# Patient Record
Sex: Female | Born: 1970 | Race: Black or African American | Marital: Single | Smoking: Never smoker
Health system: Southern US, Community
[De-identification: ages and names within clinical notes are randomized; demographics above are authoritative.]

## PROBLEM LIST (undated history)

## (undated) DIAGNOSIS — Z21 Asymptomatic human immunodeficiency virus [HIV] infection status: Secondary | ICD-10-CM

## (undated) DIAGNOSIS — B20 Human immunodeficiency virus [HIV] disease: Secondary | ICD-10-CM

## (undated) DIAGNOSIS — D219 Benign neoplasm of connective and other soft tissue, unspecified: Secondary | ICD-10-CM

## (undated) HISTORY — DX: Human immunodeficiency virus (HIV) disease: B20

## (undated) HISTORY — DX: Benign neoplasm of connective and other soft tissue, unspecified: D21.9

## (undated) HISTORY — DX: Asymptomatic human immunodeficiency virus (hiv) infection status: Z21

---

## 2013-09-14 ENCOUNTER — Encounter: Payer: Self-pay | Admitting: Family Medicine

## 2013-09-14 ENCOUNTER — Ambulatory Visit (INDEPENDENT_AMBULATORY_CARE_PROVIDER_SITE_OTHER): Payer: Medicaid Other | Admitting: Family Medicine

## 2013-09-14 VITALS — BP 113/88 | HR 71 | Temp 98.1°F | Ht 64.75 in | Wt 143.8 lb

## 2013-09-14 DIAGNOSIS — B20 Human immunodeficiency virus [HIV] disease: Secondary | ICD-10-CM | POA: Insufficient documentation

## 2013-09-14 DIAGNOSIS — Z0289 Encounter for other administrative examinations: Secondary | ICD-10-CM | POA: Insufficient documentation

## 2013-09-14 DIAGNOSIS — Z603 Acculturation difficulty: Secondary | ICD-10-CM | POA: Insufficient documentation

## 2013-09-14 DIAGNOSIS — L708 Other acne: Secondary | ICD-10-CM

## 2013-09-14 DIAGNOSIS — Z609 Problem related to social environment, unspecified: Secondary | ICD-10-CM

## 2013-09-14 DIAGNOSIS — L700 Acne vulgaris: Secondary | ICD-10-CM

## 2013-09-14 MED ORDER — BENZOYL PEROXIDE 3.5 % EX CREA: TOPICAL_CREAM | CUTANEOUS | Status: DC

## 2013-09-14 NOTE — Assessment & Plan Note (Signed)
Patient's medicaid was not active. Unable to perform necessary lab work.  - Will need:   - UA dipstick   - urine preg  - UA ancillary  - CBC w/ diff  - CMP   - Quant gold   - Hep B   - Varicella   - RPR

## 2013-09-14 NOTE — Assessment & Plan Note (Addendum)
Spoke with Dr. Wendie Agreste. He suggested starting atripla but patient has adequate amount of current prescription to last until she can get into ID clinic.  - CD4 count  - HIV  - HIV RNA - referral to ID  - F/u in 6 weeks in immigrant clinic
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
## 2013-09-14 NOTE — Patient Instructions (Addendum)
Thank you for coming in,   We will call you with the lab results from today.   Please continue taking your medications.   Please follow up with our clinic in 6 weeks.    Please feel free to call with any questions or concerns at any time, at [phone]. --Dr. Raeford Razor

## 2013-09-14 NOTE — Assessment & Plan Note (Signed)
Facial acne  - benzoyl peroxide 3.5 % cream

## 2013-09-15 LAB — HIV ANTIBODY (ROUTINE TESTING W REFLEX): HIV 1&2 Ab, 4th Generation: REACTIVE — AB

## 2013-09-15 LAB — HIV-1 RNA QUANT-NO REFLEX-BLD

## 2013-09-15 LAB — T-HELPER CELLS (CD4) COUNT (NOT AT ARMC)
ABSOLUTE CD4: 152 /uL — AB (ref 381–1469)
CD4 T Helper %: 17 % — ABNORMAL LOW (ref 32–62)
TOTAL LYMPHOCYTE COUNT: 893 /uL (ref 700–3300)
TOTAL LYMPHOCYTE: 47 % — AB (ref 12–46)
WBC, lymph enumeration: 1.9 10*3/uL — ABNORMAL LOW (ref 4.0–10.5)

## 2013-09-15 LAB — HIV 1/2 CONFIRMATION
HIV 1 ANTIBODY: POSITIVE — AB
HIV-2 Ab: NEGATIVE

## 2013-09-16 ENCOUNTER — Telehealth: Payer: Self-pay | Admitting: Licensed Clinical Social Worker

## 2013-09-16 NOTE — Telephone Encounter (Signed)
Patient was referred by [facility] to be seen for new 042, I called the patient's case manager which is the number listed and was given the patient's phone number to call her directly. I called the patient and she did not understand me, the brother got on the phone wanting to know why she needs so many appointments and what is wrong. I stated this is a follow up visit and there is nothing urgent going on. I did not tell him the name of our clinic, I contacted a [place] interpreter to talk to the patient directly and patient did not understand her either. The brother once again got on the phone and said he would relay the time of the appointment to the patient. Patient agreed to this and the time and date of the appointment was given. I called the case manager Kim back and gave her details about the appointment including location since she will be bringing her.

## 2013-09-29 ENCOUNTER — Ambulatory Visit
Admission: RE | Admit: 2013-09-29 | Discharge: 2013-09-29 | Disposition: A | Discharge status: Home / Self Care | Payer: Medicaid Other | Source: Ambulatory Visit | Attending: Infectious Disease | Admitting: Infectious Disease

## 2013-09-29 ENCOUNTER — Other Ambulatory Visit: Payer: Self-pay | Admitting: Infectious Disease

## 2013-09-29 DIAGNOSIS — Z0289 Encounter for other administrative examinations: Secondary | ICD-10-CM

## 2013-10-06 ENCOUNTER — Ambulatory Visit (INDEPENDENT_AMBULATORY_CARE_PROVIDER_SITE_OTHER): Payer: Medicaid Other | Admitting: Internal Medicine

## 2013-10-06 ENCOUNTER — Encounter: Payer: Self-pay | Admitting: Internal Medicine

## 2013-10-06 VITALS — BP 119/72 | HR 60 | Temp 97.2°F | Wt 146.0 lb

## 2013-10-06 DIAGNOSIS — Z79899 Other long term (current) drug therapy: Secondary | ICD-10-CM

## 2013-10-06 DIAGNOSIS — Z113 Encounter for screening for infections with a predominantly sexual mode of transmission: Secondary | ICD-10-CM

## 2013-10-06 DIAGNOSIS — Z23 Encounter for immunization: Secondary | ICD-10-CM

## 2013-10-06 DIAGNOSIS — B20 Human immunodeficiency virus [HIV] disease: Secondary | ICD-10-CM

## 2013-10-06 LAB — LIPID PANEL
CHOLESTEROL: 175 mg/dL (ref 0–200)
HDL: 55 mg/dL (ref >39)
LDL Cholesterol: 102 mg/dL — ABNORMAL HIGH (ref 0–99)
TRIGLYCERIDES: 90 mg/dL (ref <150)
Total CHOL/HDL Ratio: 3.2 Ratio
VLDL: 18 mg/dL (ref 0–40)

## 2013-10-06 LAB — HEPATITIS A ANTIBODY, TOTAL: Hep A Total Ab: REACTIVE — AB

## 2013-10-06 LAB — HEPATITIS C ANTIBODY: HCV AB: NEGATIVE

## 2013-10-06 LAB — HEPATITIS B SURFACE ANTIGEN: Hepatitis B Surface Ag: NEGATIVE

## 2013-10-06 LAB — RPR

## 2013-10-06 LAB — HEPATITIS B CORE ANTIBODY, TOTAL: HEP B C TOTAL AB: NONREACTIVE

## 2013-10-06 LAB — HEPATITIS B SURFACE ANTIBODY,QUALITATIVE: HEP B S AB: NEGATIVE

## 2013-10-06 MED ORDER — SULFAMETHOXAZOLE-TRIMETHOPRIM 400-80 MG PO TABS: 1.0000 | ORAL_TABLET | Freq: Every day | ORAL | Status: DC

## 2013-10-06 MED ORDER — ELVITEG-COBIC-EMTRICIT-TENOFDF 150-150-200-300 MG PO TABS: 1.0000 | ORAL_TABLET | Freq: Every day | ORAL | Status: DC
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
## 2013-10-07 LAB — CRYPTOCOCCAL ANTIGEN: CRYPTO AG: NEGATIVE

## 2013-10-07 LAB — GLUCOSE 6 PHOSPHATE DEHYDROGENASE: G-6PDH: 11.5 U/g{Hb} (ref 7.0–20.5)

## 2013-10-08 LAB — TOXOPLASMA GONDII ANTIBODY, IGG

## 2013-10-11 LAB — HLA B*5701: HLA-B 5701 W/RFLX HLA-B HIGH: NEGATIVE

## 2013-10-20 ENCOUNTER — Ambulatory Visit (INDEPENDENT_AMBULATORY_CARE_PROVIDER_SITE_OTHER): Payer: Medicaid Other | Admitting: Family Medicine

## 2013-10-20 ENCOUNTER — Encounter: Payer: Self-pay | Admitting: Family Medicine

## 2013-10-20 VITALS — BP 125/70 | HR 66 | Temp 98.2°F | Ht 65.0 in | Wt 148.5 lb

## 2013-10-20 DIAGNOSIS — H5210 Myopia, unspecified eye: Secondary | ICD-10-CM

## 2013-10-20 DIAGNOSIS — Z0289 Encounter for other administrative examinations: Secondary | ICD-10-CM

## 2013-10-20 DIAGNOSIS — B20 Human immunodeficiency virus [HIV] disease: Secondary | ICD-10-CM

## 2013-10-20 DIAGNOSIS — K029 Dental caries, unspecified: Secondary | ICD-10-CM

## 2013-10-20 DIAGNOSIS — R1013 Epigastric pain: Secondary | ICD-10-CM

## 2013-10-20 DIAGNOSIS — L700 Acne vulgaris: Secondary | ICD-10-CM

## 2013-10-20 DIAGNOSIS — H539 Unspecified visual disturbance: Secondary | ICD-10-CM

## 2013-10-20 DIAGNOSIS — L708 Other acne: Secondary | ICD-10-CM

## 2013-10-20 MED ORDER — OMEPRAZOLE 40 MG PO CPDR: 40.0000 mg | DELAYED_RELEASE_CAPSULE | Freq: Every day | ORAL | Status: DC

## 2013-10-20 MED ORDER — ADAPALENE-BENZOYL PEROXIDE 0.1-2.5 % EX GEL: CUTANEOUS | Status: DC

## 2013-10-20 NOTE — Patient Instructions (Addendum)
Take the prescription to the pharmacy.  It will be a cream for your acne for at night.   I am starting a medicine to help the acid in your stomach.    Call the number provided for the dentist.  Keep taking your HIV medicine.  I will refer you to an eye doctor.   Come back to see me in 4-6 weeks so we can do a pelvic exam and Pap smear.    It was good to see you again today.

## 2013-10-20 NOTE — Progress Notes (Signed)
Subjective:    Carolyn Williamson is a 43 y.o. female who presents to [facility] today for FU from [facility] about 1 month ago.  Since that time she has been seen at [facility] Clinic and started on Stribild and Bactrim 1 daily for prophylactic dosing.  She reports taking this medication daily.    1.  Acne: Not much improvement with Benzoyl peroxide.  Would like to try something different.  Washing face twice daily.    2.  Dental hygiene:  Asking for referral for dentist.    3.  HIV: Felt "a little weak" after starting the Stribild.  Now improved.  Taking Bactrim as well.  Overall feels well.   4.  Vision Problems:  States that she has difficulty seeing/reading up close.  Would like referral for eye doctor.     Prev health:  Currently overdue for Pap smear.  ROS as above per HPI, otherwise neg.    The following portions of the patient's history were reviewed and updated as appropriate: allergies, current medications, past medical history, family and social history, and problem list. Patient is a nonsmoker.    PMH reviewed.  Past Medical History  Diagnosis Date  . HIV (human immunodeficiency virus infection)   . Fibroids    No past surgical history on file.  Medications reviewed. Current Outpatient Prescriptions  Medication Sig Dispense Refill  . Benzoyl Peroxide 3.5 % CREA Apply a small amount to face at night  45 g  1  . elvitegravir-cobicistat-emtricitabine-tenofovir (STRIBILD) 150-150-200-300 MG TABS tablet Take 1 tablet by mouth daily with breakfast.  30 tablet  11  . sulfamethoxazole-trimethoprim (BACTRIM) 400-80 MG per tablet Take 1 tablet by mouth daily.  30 tablet  3   No current facility-administered medications for this visit.     Objective:   Physical Exam BP 125/70  Pulse 66  Temp(Src) 98.2 F (36.8 C) (Oral)  Ht 5\' 5"  (1.651 m)  Wt 148 lb 8 oz (67.359 kg)  BMI 24.71 kg/m2  LMP 05/09/2013 Gen:  Alert, cooperative patient who appears stated age in  no acute distress.  Vital signs reviewed. HEENT: EOMI,  MMM.  Poor dental hygiene noted.  Cardiac:  Regular rate and rhythm Pulm:  Clear to auscultation Abd:  Soft/nondistended.  Some mild TTP epigastrum.  Hepatojugular reflex to 7 cm above clavicle. Exts: Thin, no edema  No results found for this or any previous visit (from the past 72 hour(s)).

## 2013-10-21 ENCOUNTER — Encounter: Payer: Self-pay | Admitting: *Deleted

## 2013-10-21 DIAGNOSIS — K029 Dental caries, unspecified: Secondary | ICD-10-CM | POA: Insufficient documentation

## 2013-10-21 DIAGNOSIS — R1013 Epigastric pain: Secondary | ICD-10-CM | POA: Insufficient documentation

## 2013-10-21 DIAGNOSIS — H5210 Myopia, unspecified eye: Secondary | ICD-10-CM | POA: Insufficient documentation

## 2013-10-21 NOTE — Assessment & Plan Note (Signed)
Appreciate ID recommendations.  Continue Stribild/Bactrim. Has FU appt scheduled. Encouraged compliance.

## 2013-10-21 NOTE — Assessment & Plan Note (Signed)
Attempt Epiduo.  May need medicaid prior auth

## 2013-10-21 NOTE — Assessment & Plan Note (Signed)
Found on exam. After discussion, sounds like she has GERD-like symptoms after eating.   Trial of PPI.

## 2013-10-21 NOTE — Assessment & Plan Note (Signed)
Vision acuity today. Will refer to ophtho due to longstanding disease and history of HIV.

## 2013-10-21 NOTE — Assessment & Plan Note (Signed)
States she was seen at [facility] since last visit here.  Will need to obtain records.    Also, hepatogular reflux noted on exam with elevated JVD.  No hepatomegaly.  No cardiac murmur noted.  Needs CMET, will see if we need to obtain refugee labs as well.

## 2013-10-21 NOTE — Assessment & Plan Note (Signed)
Given list of dentists who accept Medicaid today.

## 2013-11-03 ENCOUNTER — Ambulatory Visit (INDEPENDENT_AMBULATORY_CARE_PROVIDER_SITE_OTHER): Payer: Medicaid Other | Admitting: Internal Medicine

## 2013-11-03 ENCOUNTER — Encounter: Payer: Self-pay | Admitting: Internal Medicine

## 2013-11-03 VITALS — BP 125/78 | HR 60 | Temp 97.8°F | Wt 149.0 lb

## 2013-11-03 DIAGNOSIS — M545 Low back pain, unspecified: Secondary | ICD-10-CM

## 2013-11-03 DIAGNOSIS — Z23 Encounter for immunization: Secondary | ICD-10-CM

## 2013-11-03 MED ORDER — TRAMADOL HCL 50 MG PO TABS: 50.0000 mg | ORAL_TABLET | Freq: Two times a day (BID) | ORAL | Status: DC | PRN
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
## 2013-11-30 ENCOUNTER — Other Ambulatory Visit (HOSPITAL_COMMUNITY)
Admission: RE | Admit: 2013-11-30 | Discharge: 2013-11-30 | Disposition: A | Discharge status: Home / Self Care | Payer: Medicaid Other | Source: Ambulatory Visit | Attending: Family Medicine | Admitting: Family Medicine

## 2013-11-30 ENCOUNTER — Ambulatory Visit (INDEPENDENT_AMBULATORY_CARE_PROVIDER_SITE_OTHER): Payer: Medicaid Other | Admitting: Family Medicine

## 2013-11-30 VITALS — BP 128/67 | HR 55 | Temp 98.7°F | Ht 65.0 in | Wt 151.8 lb

## 2013-11-30 DIAGNOSIS — Z1151 Encounter for screening for human papillomavirus (HPV): Secondary | ICD-10-CM | POA: Diagnosis present

## 2013-11-30 DIAGNOSIS — L700 Acne vulgaris: Secondary | ICD-10-CM

## 2013-11-30 DIAGNOSIS — Z124 Encounter for screening for malignant neoplasm of cervix: Secondary | ICD-10-CM

## 2013-11-30 DIAGNOSIS — Z01419 Encounter for gynecological examination (general) (routine) without abnormal findings: Secondary | ICD-10-CM | POA: Insufficient documentation

## 2013-11-30 DIAGNOSIS — B20 Human immunodeficiency virus [HIV] disease: Secondary | ICD-10-CM

## 2013-11-30 DIAGNOSIS — H5210 Myopia, unspecified eye: Secondary | ICD-10-CM

## 2013-11-30 DIAGNOSIS — L708 Other acne: Secondary | ICD-10-CM

## 2013-11-30 DIAGNOSIS — H5213 Myopia, bilateral: Secondary | ICD-10-CM

## 2013-11-30 DIAGNOSIS — M545 Low back pain, unspecified: Secondary | ICD-10-CM | POA: Insufficient documentation

## 2013-11-30 MED ORDER — TRAMADOL HCL 50 MG PO TABS: 50.0000 mg | ORAL_TABLET | Freq: Two times a day (BID) | ORAL | Status: DC | PRN

## 2013-11-30 MED ORDER — ADAPALENE 0.1 % EX LOTN: TOPICAL_LOTION | CUTANEOUS | Status: DC

## 2013-11-30 NOTE — Assessment & Plan Note (Signed)
Similar complaints as previous visit; previous referral documented as planned but order not entered. Order for referral to ophthalmology placed today. F/u as needed.

## 2013-11-30 NOTE — Assessment & Plan Note (Signed)
Appreciate ID assistance. No signs / symptoms to suggest infection. Continue Strilbid and Bactrim prophylaxis. F/u with ID as scheduled. Reiterated importance of compliance with medications.

## 2013-11-30 NOTE — Assessment & Plan Note (Signed)
Self-limited, chronic, without red flags on exam or per history. Exacerbated by standing long periods at work. Refilled Rx for tramadol. F/u PRN.
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
## 2013-11-30 NOTE — Assessment & Plan Note (Signed)
Rx for Differ[place] branded (Medicaid preferred). F/u as needed.

## 2013-11-30 NOTE — Patient Instructions (Addendum)
The eye doctor will call you in several weeks to set up an appointment.  Take the Tramadol when you need it for back pain.    Use the face cream at night to help with acne.    Make an appointment to see me in about 3 months to make sure you are doing well.

## 2013-12-01 LAB — CYTOLOGY - PAP

## 2013-12-02 ENCOUNTER — Telehealth: Payer: Self-pay | Admitting: *Deleted

## 2013-12-02 ENCOUNTER — Encounter: Payer: Self-pay | Admitting: *Deleted

## 2013-12-02 ENCOUNTER — Ambulatory Visit (INDEPENDENT_AMBULATORY_CARE_PROVIDER_SITE_OTHER): Payer: Medicaid Other | Admitting: Internal Medicine

## 2013-12-02 ENCOUNTER — Encounter: Payer: Self-pay | Admitting: Family Medicine

## 2013-12-02 VITALS — BP 129/75 | HR 60 | Temp 97.3°F | Wt 155.0 lb

## 2013-12-02 DIAGNOSIS — Z21 Asymptomatic human immunodeficiency virus [HIV] infection status: Secondary | ICD-10-CM

## 2013-12-02 DIAGNOSIS — H5210 Myopia, unspecified eye: Secondary | ICD-10-CM

## 2013-12-02 DIAGNOSIS — H109 Unspecified conjunctivitis: Secondary | ICD-10-CM

## 2013-12-02 DIAGNOSIS — H5213 Myopia, bilateral: Secondary | ICD-10-CM

## 2013-12-02 MED ORDER — NAPHAZOLINE-GLYCERIN-ZINC SULF 0.012-0.25-0.25 % OP SOLN: 2.0000 [drp] | Freq: Every day | OPHTHALMIC | Status: DC

## 2013-12-02 NOTE — Telephone Encounter (Signed)
Please see note from [place].  Left voice message on nurse needing a referral for Myopia.  Please give the office a call at [phone].  Derl Barrow, RN

## 2013-12-02 NOTE — Telephone Encounter (Signed)
This encounter was created in error - please disregard.

## 2013-12-02 NOTE — Progress Notes (Signed)
Patient ID: Carolyn Williamson, female   DOB: 11-22-1970, 43 y.o.   MRN: [id number]       Patient ID: Carolyn Williamson, female   DOB: December 09, 1970, 43 y.o.   MRN: [id number]  HPI Carolyn Williamson is doing well with stribild not missing a dose. 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 Has eye irritation. Dry eyes which she is rubbing often. Also needs glasses.  Denies fever, chills, nightsweats, cough. She is concerned about the bills she is getting $3 copay for each of her prescriptions as well as a bill from the hospital. She is not currently working.  Outpatient Encounter Prescriptions as of 12/02/2013  Medication Sig  . Adapalene (DIFFERIN) 0.1 % LOTN Apply small amount to face at night  . elvitegravir-cobicistat-emtricitabine-tenofovir (STRIBILD) 150-150-200-300 MG TABS tablet Take 1 tablet by mouth daily with breakfast.  . omeprazole (PRILOSEC) 40 MG capsule Take 1 capsule (40 mg total) by mouth daily.  Marland Kitchen sulfamethoxazole-trimethoprim (BACTRIM) 400-80 MG per tablet Take 1 tablet by mouth daily.  . traMADol (ULTRAM) 50 MG tablet Take 1 tablet (50 mg total) by mouth every 12 (twelve) hours as needed.     Patient Active Problem List   Diagnosis Date Noted  . Low back pain 11/30/2013  . Abdominal pain, epigastric 10/21/2013  . Myopia 10/21/2013  . Dental caries 10/21/2013  . Immigrant with language difficulty 09/14/2013  . Refugee health examination 09/14/2013  . HIV disease 09/14/2013  . Acne vulgaris 09/14/2013     Health Maintenance Due  Topic Date Due  . Tetanus/tdap  08/03/1989     Review of Systems + back pain, difficulty reading. 10 point ros is negative Physical Exam   BP 129/75  Pulse 60  Temp(Src) 97.3 F (36.3 C) (Oral)  Wt 155 lb (70.308 kg)  LMP 05/04/2013 Physical Exam  Constitutional:  oriented to person, place, and time. appears well-developed and well-nourished. No distress.  HENT:  Mouth/Throat: Oropharynx is clear and  moist. No oropharyngeal exudate. +bilateral conjunctivitis Cardiovascular: Normal rate, regular rhythm and normal heart sounds. Exam reveals no gallop and no friction rub.  No murmur heard.  Pulmonary/Chest: Effort normal and breath sounds normal. No respiratory distress.  has no wheezes.  Abdominal: Soft. Bowel sounds are normal.  exhibits no distension. There is no tenderness.  Lymphadenopathy: no cervical adenopathy.  Neurological: alert and oriented to person, place, and time.  Skin: Skin is warm and dry. No rash noted. No erythema.  Psychiatric: a normal mood and affect.  behavior is normal.   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

## 2013-12-02 NOTE — Telephone Encounter (Signed)
Referral for Myopia noted in 11/30/13 OV notes by Dr. Mingo Amber.  Called to confirm that this referral is originating at [facility].  Pt has [employer] and requiring Medicaid approval.  Requested a call back to assure referral has been started.

## 2013-12-03 LAB — HIV-1 RNA QUANT-NO REFLEX-BLD: HIV 1 RNA Quant: <20 copies/mL (ref <20)

## 2013-12-03 LAB — T-HELPER CELL (CD4) - (RCID CLINIC ONLY)
CD4 % Helper T Cell: 14 % — ABNORMAL LOW (ref 33–55)
CD4 T Cell Abs: 120 /uL — ABNORMAL LOW (ref 400–2700)

## 2014-01-05 ENCOUNTER — Encounter: Payer: Self-pay | Admitting: Family Medicine

## 2014-01-05 ENCOUNTER — Ambulatory Visit (INDEPENDENT_AMBULATORY_CARE_PROVIDER_SITE_OTHER): Payer: Medicaid Other | Admitting: Family Medicine

## 2014-01-05 VITALS — BP 122/79 | HR 84 | Temp 98.4°F | Ht 65.0 in | Wt 156.0 lb

## 2014-01-05 DIAGNOSIS — J302 Other seasonal allergic rhinitis: Secondary | ICD-10-CM

## 2014-01-05 DIAGNOSIS — M545 Low back pain, unspecified: Secondary | ICD-10-CM

## 2014-01-05 DIAGNOSIS — Z603 Acculturation difficulty: Secondary | ICD-10-CM

## 2014-01-05 DIAGNOSIS — L700 Acne vulgaris: Secondary | ICD-10-CM

## 2014-01-05 DIAGNOSIS — K029 Dental caries, unspecified: Secondary | ICD-10-CM

## 2014-01-05 DIAGNOSIS — E28319 Asymptomatic premature menopause: Secondary | ICD-10-CM

## 2014-01-05 DIAGNOSIS — H5213 Myopia, bilateral: Secondary | ICD-10-CM

## 2014-01-05 MED ORDER — CETIRIZINE HCL 10 MG PO TABS: 10.0000 mg | ORAL_TABLET | Freq: Every day | ORAL | Status: DC

## 2014-01-05 NOTE — Progress Notes (Signed)
Subjective:    Carolyn Williamson is a 43 y.o. female who presents to [facility] today for several issues:  1.  Headaches:  Also with feelings of "itchiness" and "full body heat" which occur at home.  This has only been present x 2 weeks, although HA's predate this.  Not at work.  HA's are parietal, bilateral, and worse with stress better with rest.  These other symptoms occur when she is at home.  MP Feb 28.  Was having gradually more spread out menses in months leading up to final period, sometimes as many as 6 months apart.      2.  Decreased vision:  Difficulty seeing small letters up close.  Desires glasses.  Has never had them previously.    Some back pain with standing at her job.  Better with Tramadol.   Does endorse some anxiety, but denies any flashbacks, bad dreams, or worries about her past.  DOES endorse financial worries over her future.   ROS as above per HPI, otherwise neg.  Pertinently, no chest pain, palpitations, SOB, Fever, Chills, Abd pain, N/V/D.   The following portions of the patient's history were reviewed and updated as appropriate: allergies, current medications, past medical history, family and social history, and problem list. Patient is a nonsmoker.    PMH reviewed.  Past Medical History  Diagnosis Date  . HIV (human immunodeficiency virus infection)   . Fibroids    No past surgical history on file.  Medications reviewed. Current Outpatient Prescriptions  Medication Sig Dispense Refill  . Adapalene (DIFFERIN) 0.1 % LOTN Apply small amount to face at night  59 mL  3  . elvitegravir-cobicistat-emtricitabine-tenofovir (STRIBILD) 150-150-200-300 MG TABS tablet Take 1 tablet by mouth daily with breakfast.  30 tablet  11  . Naphazoline-Glycerin-Zinc Sulf (CLEAR EYES MAXIMUM ITCHY EYE) 0.012-0.25-0.25 % SOLN Apply 2 drops to eye daily.  1 Bottle  0  . omeprazole (PRILOSEC) 40 MG capsule Take 1 capsule (40 mg total) by mouth daily.  30 capsule  3  .  sulfamethoxazole-trimethoprim (BACTRIM) 400-80 MG per tablet Take 1 tablet by mouth daily.  30 tablet  3  . traMADol (ULTRAM) 50 MG tablet Take 1 tablet (50 mg total) by mouth every 12 (twelve) hours as needed.  45 tablet  1   No current facility-administered medications for this visit.     Objective:   Physical Exam BP 122/79  Pulse 84  Temp(Src) 98.4 F (36.9 C) (Oral)  Ht 5\' 5"  (1.651 m)  Wt 156 lb (70.761 kg)  BMI 25.96 kg/m2 Gen:  Alert, cooperative patient who appears stated age in no acute distress.  Vital signs reviewed. HEENT: EOMI,  MMM Skin:  Closed comedones scattered across cheeks and nose.  Better than prior exam.  Cardiac:  Regular rate and rhythm  Pulm:  Clear to auscultation bilaterally    No results found for this or any previous visit (from the past 72 hour(s)).

## 2014-01-05 NOTE — Patient Instructions (Signed)
Bring your bills next time you come.  Call the dentist.  Take the Zyrtec for your heat and itching.    I will try to find glasses for you.    Come back in 4-6 weeks

## 2014-01-06 DIAGNOSIS — J302 Other seasonal allergic rhinitis: Secondary | ICD-10-CM | POA: Insufficient documentation

## 2014-01-06 DIAGNOSIS — E28319 Asymptomatic premature menopause: Secondary | ICD-10-CM | POA: Insufficient documentation

## 2014-01-06 NOTE — Assessment & Plan Note (Addendum)
Provided with dental list again today.  To bring this back if having trouble making appt and someone here can call. Circled HD number as one that takes Medicaid for her to call.

## 2014-01-06 NOTE — Assessment & Plan Note (Signed)
Only started differin last week.  To continue with this. Add moisturizer

## 2014-01-06 NOTE — Assessment & Plan Note (Signed)
Think her "heat" and full body itching are seasonal allergies. Zyrtec for relief. 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  Can attempt OCP's next visit if persists despite Zyrtec.

## 2014-01-06 NOTE — Assessment & Plan Note (Signed)
Better with tramadol.

## 2014-01-06 NOTE — Assessment & Plan Note (Signed)
She is very concerned over her bills.  States she still has to pay $3 copay each time she picks up meds.  Last HIV note would look into waiving this fee. I will touch base with our social worker to see if this is a possibility.

## 2014-01-06 NOTE — Assessment & Plan Note (Signed)
Presbyopia.   Will contact social worker about decreased cost for glasses.

## 2014-01-08 ENCOUNTER — Telehealth: Payer: Self-pay | Admitting: Clinical

## 2014-01-08 NOTE — Telephone Encounter (Signed)
With the use of [employer] CSW left a message for Carolyn Williamson.  CSW has been able to get Carolyn Williamson a referral to an optometrist for an eye exam and glasses:  Dr. Webb Laws [address], [place] (appt to be scheduled with Marcie Bal or Anderson Malta)  The patient will be responsible to pay $25 at the visit and the [facility] will be billed for the rest of the amount. CSW will try to contact Carolyn Williamson again later today.  Hunt Oris, MSW, [place]

## 2014-01-14 ENCOUNTER — Telehealth: Payer: Self-pay | Admitting: Internal Medicine

## 2014-01-14 ENCOUNTER — Ambulatory Visit (INDEPENDENT_AMBULATORY_CARE_PROVIDER_SITE_OTHER): Payer: Medicaid Other | Admitting: Internal Medicine

## 2014-01-14 ENCOUNTER — Encounter: Payer: Self-pay | Admitting: Internal Medicine

## 2014-01-14 VITALS — BP 126/79 | HR 72 | Temp 98.3°F | Wt 159.8 lb

## 2014-01-14 DIAGNOSIS — R110 Nausea: Secondary | ICD-10-CM

## 2014-01-14 DIAGNOSIS — Z21 Asymptomatic human immunodeficiency virus [HIV] infection status: Secondary | ICD-10-CM | POA: Diagnosis not present

## 2014-01-14 DIAGNOSIS — K5900 Constipation, unspecified: Secondary | ICD-10-CM | POA: Diagnosis not present

## 2014-01-14 DIAGNOSIS — Z23 Encounter for immunization: Secondary | ICD-10-CM | POA: Diagnosis present

## 2014-01-14 NOTE — Telephone Encounter (Signed)
I noticed after visit that no recent cbc and bmp have been done. We will have her come in on 11/9 for blood work

## 2014-01-14 NOTE — Progress Notes (Signed)
Patient ID: Carolyn Williamson, female   DOB: 08-Oct-1970, 43 y.o.   MRN: [id number]       Patient ID: Carolyn Williamson, female   DOB: [birth year], 43 y.o.   MRN: [id number]  HPI  43yo F with HIV disease, CD 4 count of 120/VL<20 on stribild and bacrim for OI proph. Follows up in family practice clinic refugee health. She reports nausea nad constipation for hte past 2 wk. Occasionally goes to work and has nausea and feels light-headed, does not endorse drinking much fluid.  Here with interpreter.no difficulty with taking medications  Outpatient Encounter Prescriptions as of 01/14/2014  Medication Sig  . Adapalene (DIFFERIN) 0.1 % LOTN Apply small amount to face at night  . cetirizine (ZYRTEC) 10 MG tablet Take 1 tablet (10 mg total) by mouth daily.  Marland Kitchen elvitegravir-cobicistat-emtricitabine-tenofovir (STRIBILD) 150-150-200-300 MG TABS tablet Take 1 tablet by mouth daily with breakfast.  . Naphazoline-Glycerin-Zinc Sulf (CLEAR EYES MAXIMUM ITCHY EYE) 0.012-0.25-0.25 % SOLN Apply 2 drops to eye daily.  Marland Kitchen omeprazole (PRILOSEC) 40 MG capsule Take 1 capsule (40 mg total) by mouth daily.  Marland Kitchen sulfamethoxazole-trimethoprim (BACTRIM) 400-80 MG per tablet Take 1 tablet by mouth daily.  . traMADol (ULTRAM) 50 MG tablet Take 1 tablet (50 mg total) by mouth every 12 (twelve) hours as needed.     Patient Active Problem List   Diagnosis Date Noted  . Premature menopause 01/06/2014  . Seasonal allergies 01/06/2014  . Low back pain 11/30/2013  . Abdominal pain, epigastric 10/21/2013  . Myopia 10/21/2013  . Dental caries 10/21/2013  . Immigrant with language difficulty 09/14/2013  . Refugee health examination 09/14/2013  . HIV disease 09/14/2013  . Acne vulgaris 09/14/2013     Health Maintenance Due  Topic Date Due  . TETANUS/TDAP  08/03/1989     Review of Systems + constipation, occ nausea Physical Exam   BP 126/79 mmHg  Pulse 72  Temp(Src) 98.3 F (36.8 C) (Oral)  Wt 159 lb 12 oz (72.462 kg)  LMP  04/16/2013 Physical Exam  Constitutional:  oriented to person, place, and time. appears well-developed and well-nourished. No distress.  HENT:  Mouth/Throat: Oropharynx is clear and moist. No oropharyngeal exudate.  Cardiovascular: Normal rate, regular rhythm and normal heart sounds. Exam reveals no gallop and no friction rub.  No murmur heard.  Pulmonary/Chest: Effort normal and breath sounds normal. No respiratory distress.  has no wheezes.  Abdominal: Soft. Bowel sounds are normal.  exhibits no distension. There is no tenderness.  Lymphadenopathy: no cervical adenopathy.  Neurological: alert and oriented to person, place, and time.  Skin: Skin is warm and dry. No rash noted. No erythema. Dry skin to face only Psychiatric: a normal mood and affect. His behavior is normal.   Lab Results  Component Value Date   CD4TCELL 14* 12/02/2013   Lab Results  Component Value Date   CD4TABS 120* 12/02/2013   Lab Results  Component Value Date   HIV1RNAQUANT <20 12/02/2013   Lab Results  Component Value Date   HEPBSAB NEG 10/06/2013    Assessment and Plan  hiv = will repeat labs today to ensure she is doing well  Health promotion = all ready received flu vaccination and pneumonia  Constipation = nausea can also be part of this process. Recommended to try increasing oral intake of fluids. Will give rx for colace to help loosen stool  Dizziness = described in setting of poor po intake, asked her to keep hydrated  Nausea= can give zofran as  needed for nausea if it keeps occurring outside of constipation  rtc in 3 months

## 2014-01-18 ENCOUNTER — Telehealth: Payer: Self-pay | Admitting: *Deleted

## 2014-01-18 ENCOUNTER — Other Ambulatory Visit: Payer: Medicaid Other

## 2014-01-18 DIAGNOSIS — Z21 Asymptomatic human immunodeficiency virus [HIV] infection status: Secondary | ICD-10-CM

## 2014-01-18 LAB — COMPLETE METABOLIC PANEL WITH GFR
ALT: 13 U/L (ref 0–35)
AST: 20 U/L (ref 0–37)
Albumin: 3.6 g/dL (ref 3.5–5.2)
Alkaline Phosphatase: 114 U/L (ref 39–117)
BILIRUBIN TOTAL: 0.4 mg/dL (ref 0.2–1.2)
BUN: 10 mg/dL (ref 6–23)
CALCIUM: 9.1 mg/dL (ref 8.4–10.5)
CHLORIDE: 103 meq/L (ref 96–112)
CO2: 28 meq/L (ref 19–32)
CREATININE: 0.71 mg/dL (ref 0.50–1.10)
GFR, Est African American: >89 mL/min
GLUCOSE: 87 mg/dL (ref 70–99)
Potassium: 4.4 mEq/L (ref 3.5–5.3)
Sodium: 137 mEq/L (ref 135–145)
Total Protein: 8.2 g/dL (ref 6.0–8.3)

## 2014-01-18 NOTE — Telephone Encounter (Signed)
Patient was here this morning for labs, asked to speak with the nurse.  SHe stated that she is concerned about getting future refills of her medication, as she no longer will receive services through her "nurse/relocation program."  Patient has medicaid, is active at [employer].  RN spoke with Sam, pharmacist, who confirmed that they waive the copay for the patient and have already set up delivery of her medications (per Christean Grief delivered her last refill, had no trouble contacting the patient and dropping off the medication).  Per Sam, patient was instructed to call if she moves or changes phone numbers. They will call if they have any problems. Landis Gandy, RN

## 2014-01-19 LAB — CBC WITH DIFFERENTIAL/PLATELET
BASOS ABS: 0 10*3/uL (ref 0.0–0.1)
BASOS PCT: 1 % (ref 0–1)
EOS ABS: 0.3 10*3/uL (ref 0.0–0.7)
EOS PCT: 17 % — AB (ref 0–5)
HEMATOCRIT: 34.7 % — AB (ref 36.0–46.0)
Hemoglobin: 12.4 g/dL (ref 12.0–15.0)
LYMPHS PCT: 34 % (ref 12–46)
Lymphs Abs: 0.6 10*3/uL — ABNORMAL LOW (ref 0.7–4.0)
MCH: 30.6 pg (ref 26.0–34.0)
MCHC: 35.7 g/dL (ref 30.0–36.0)
MCV: 85.7 fL (ref 78.0–100.0)
MONO ABS: 0.3 10*3/uL (ref 0.1–1.0)
Monocytes Relative: 14 % — ABNORMAL HIGH (ref 3–12)
Neutro Abs: 0.6 10*3/uL — ABNORMAL LOW (ref 1.7–7.7)
Neutrophils Relative %: 34 % — ABNORMAL LOW (ref 43–77)
PLATELETS: 251 10*3/uL (ref 150–400)
RBC: 4.05 MIL/uL (ref 3.87–5.11)
RDW: 13.4 % (ref 11.5–15.5)
WBC: 1.8 10*3/uL — AB (ref 4.0–10.5)

## 2014-01-19 LAB — T-HELPER CELL (CD4) - (RCID CLINIC ONLY)
CD4 % Helper T Cell: 20 % — ABNORMAL LOW (ref 33–55)
CD4 T CELL ABS: 120 /uL — AB (ref 400–2700)

## 2014-01-19 LAB — HIV-1 RNA QUANT-NO REFLEX-BLD: HIV 1 RNA Quant: <20 copies/mL (ref <20)

## 2014-01-21 ENCOUNTER — Other Ambulatory Visit: Payer: Self-pay | Admitting: *Deleted

## 2014-01-21 ENCOUNTER — Other Ambulatory Visit: Payer: Self-pay | Admitting: Internal Medicine

## 2014-01-21 DIAGNOSIS — B20 Human immunodeficiency virus [HIV] disease: Secondary | ICD-10-CM

## 2014-01-21 MED ORDER — SULFAMETHOXAZOLE-TRIMETHOPRIM 400-80 MG PO TABS: 1.0000 | ORAL_TABLET | Freq: Every day | ORAL | Status: DC

## 2014-01-29 ENCOUNTER — Encounter: Payer: Self-pay | Admitting: Clinical

## 2014-01-29 ENCOUNTER — Ambulatory Visit (INDEPENDENT_AMBULATORY_CARE_PROVIDER_SITE_OTHER): Payer: Medicaid Other | Admitting: Family Medicine

## 2014-01-29 VITALS — BP 128/72 | HR 77 | Temp 98.2°F | Ht 65.0 in | Wt 161.0 lb

## 2014-01-29 DIAGNOSIS — H5213 Myopia, bilateral: Secondary | ICD-10-CM

## 2014-01-29 DIAGNOSIS — L700 Acne vulgaris: Secondary | ICD-10-CM

## 2014-01-29 DIAGNOSIS — S39011A Strain of muscle, fascia and tendon of abdomen, initial encounter: Secondary | ICD-10-CM

## 2014-01-29 MED ORDER — IBUPROFEN 600 MG PO TABS: 600.0000 mg | ORAL_TABLET | Freq: Three times a day (TID) | ORAL | Status: DC | PRN

## 2014-01-29 MED ORDER — TETRACYCLINE HCL 250 MG PO CAPS: 250.0000 mg | ORAL_CAPSULE | Freq: Two times a day (BID) | ORAL | Status: DC

## 2014-01-29 NOTE — Assessment & Plan Note (Signed)
Will refer to [facility] for glasses.   Patient states that her brother can translate for her.

## 2014-01-29 NOTE — Progress Notes (Signed)
CSW met with the pt and interpretor to discuss vision care. CSW explained how the program works and that pt would have to pay $25 which would include the exam and glasses. The patient is aware that she will need to have someone present who speaks [place]. Pt understands that she will need to have someone with her who speaks [place]. Pt will follow up with CSW once she confirms if someone in her family can be present for her future appointment.  Hunt Oris, MSW, [place]

## 2014-01-29 NOTE — Assessment & Plan Note (Signed)
Not sure how much is being lost in translation. Switching to Tetracycline to see if this controls her acne.

## 2014-01-29 NOTE — Patient Instructions (Signed)
Take the Tetracycline twice a day for acne.  Take the Ibuprofen when needed, no more than three times a day for pain relief.  It was very good to see you today, happy Thanksgiving!

## 2014-01-29 NOTE — Progress Notes (Signed)
Subjective:    Carolyn Williamson is a 43 y.o. female refugee from [place] who presents to [facility] today for FU for several issues:  1.  Acne:  Stopped Epiderm and started just differin (adapalene) by itself.   Made the acne much worse.  Went back to [employer] and feels it is somewhat better.  However, still having trouble with acne.  Has not tried anything else.   2.  Right side pain:  Lifted "something heavy" while at work 1 week ago. Has had pain since then.  Concerned about the pain that it means something bad. Painmostly when twisting or attempting to lift something. Eating and drinking well, no nausea.  NO fevers/chils.    Still desires glasses.  ROS as above per HPI, otherwise neg.  Pertinently, no chest pain, palpitations, SOB, Fever, Chills, N/V/D.   The following portions of the patient's history were reviewed and updated as appropriate: allergies, current medications, past medical history, family and social history, and problem list. Patient is a nonsmoker.    PMH reviewed.  Past Medical History  Diagnosis Date  . HIV (human immunodeficiency virus infection)   . Fibroids    No past surgical history on file.  Medications reviewed. Current Outpatient Prescriptions  Medication Sig Dispense Refill  . Adapalene (DIFFERIN) 0.1 % LOTN Apply small amount to face at night 59 mL 3  . cetirizine (ZYRTEC) 10 MG tablet Take 1 tablet (10 mg total) by mouth daily. 30 tablet 11  . elvitegravir-cobicistat-emtricitabine-tenofovir (STRIBILD) 150-150-200-300 MG TABS tablet Take 1 tablet by mouth daily with breakfast. 30 tablet 11  . Naphazoline-Glycerin-Zinc Sulf (CLEAR EYES MAXIMUM ITCHY EYE) 0.012-0.25-0.25 % SOLN Apply 2 drops to eye daily. 1 Bottle 0  . omeprazole (PRILOSEC) 40 MG capsule Take 1 capsule (40 mg total) by mouth daily. 30 capsule 3  . sulfamethoxazole-trimethoprim (BACTRIM) 400-80 MG per tablet Take 1 tablet by mouth daily. 30 tablet 3  . traMADol (ULTRAM) 50 MG tablet Take 1 tablet (50  mg total) by mouth every 12 (twelve) hours as needed. 45 tablet 1   No current facility-administered medications for this visit.     Objective:   Physical Exam BP 128/72 mmHg  Pulse 77  Temp(Src) 98.2 F (36.8 C) (Oral)  Ht 5\' 5"  (1.651 m)  Wt 161 lb (73.029 kg)  BMI 26.79 kg/m2  LMP 04/16/2013 Gen:  Alert, cooperative patient who appears stated age in no acute distress.  Vital signs reviewed. HEENT: EOMI,  MMM.   Cardiac:  Regular rate and rhythm . Pulm:  Clear to auscultation bilaterally  Abd:  Soft.  Only minimally tender Right LQ to Right mid-axillary line.   Skin:  Closed comedones scattered across her face.  No open comedones, no pustules.   No results found for this or any previous visit (from the past 72 hour(s)).

## 2014-02-23 ENCOUNTER — Ambulatory Visit (INDEPENDENT_AMBULATORY_CARE_PROVIDER_SITE_OTHER): Payer: Medicaid Other | Admitting: Family Medicine

## 2014-02-23 ENCOUNTER — Encounter: Payer: Self-pay | Admitting: Family Medicine

## 2014-02-23 ENCOUNTER — Telehealth: Payer: Self-pay | Admitting: Clinical

## 2014-02-23 VITALS — BP 107/75 | HR 67 | Temp 98.4°F | Ht 65.0 in | Wt 158.7 lb

## 2014-02-23 DIAGNOSIS — R600 Localized edema: Secondary | ICD-10-CM

## 2014-02-23 DIAGNOSIS — L700 Acne vulgaris: Secondary | ICD-10-CM

## 2014-02-23 DIAGNOSIS — B20 Human immunodeficiency virus [HIV] disease: Secondary | ICD-10-CM

## 2014-02-23 DIAGNOSIS — H5210 Myopia, unspecified eye: Secondary | ICD-10-CM

## 2014-02-23 DIAGNOSIS — L709 Acne, unspecified: Secondary | ICD-10-CM

## 2014-02-23 MED ORDER — TETRACYCLINE HCL 250 MG PO CAPS: 250.0000 mg | ORAL_CAPSULE | Freq: Two times a day (BID) | ORAL | Status: DC

## 2014-02-23 NOTE — Progress Notes (Signed)
Subjective:    Carolyn Williamson is a 43 y.o. female who presents to [facility] today:  1.  Acne: Not much improved on Tetracycline.  She is concerned for cosmetic reasons.  Has not worsened.  No itching.   2.  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  Has not tried anything for relief.  No shortness of breath. No polyuria/polydipsia.  No chest pain.    Also needs refill for Bactrim.     ROS as above per HPI, otherwise neg.    The following portions of the patient's history were reviewed and updated as appropriate: allergies, current medications, past medical history, family and social history, and problem list. Patient is a nonsmoker.    PMH reviewed.  Past Medical History  Diagnosis Date  . HIV (human immunodeficiency virus infection)   . Fibroids    No past surgical history on file.  Medications reviewed. Current Outpatient Prescriptions  Medication Sig Dispense Refill  . Adapalene (DIFFERIN) 0.1 % LOTN Apply small amount to face at night 59 mL 3  . cetirizine (ZYRTEC) 10 MG tablet Take 1 tablet (10 mg total) by mouth daily. 30 tablet 11  . elvitegravir-cobicistat-emtricitabine-tenofovir (STRIBILD) 150-150-200-300 MG TABS tablet Take 1 tablet by mouth daily with breakfast. 30 tablet 11  . ibuprofen (ADVIL,MOTRIN) 600 MG tablet Take 1 tablet (600 mg total) by mouth every 8 (eight) hours as needed. 30 tablet 0  . Naphazoline-Glycerin-Zinc Sulf (CLEAR EYES MAXIMUM ITCHY EYE) 0.012-0.25-0.25 % SOLN Apply 2 drops to eye daily. 1 Bottle 0  . omeprazole (PRILOSEC) 40 MG capsule Take 1 capsule (40 mg total) by mouth daily. 30 capsule 3  . sulfamethoxazole-trimethoprim (BACTRIM) 400-80 MG per tablet Take 1 tablet by mouth daily. 30 tablet 3  . tetracycline (ACHROMYCIN,SUMYCIN) 250 MG capsule Take 1 capsule (250 mg total) by mouth 2 (two) times daily before a meal. 60 capsule 0  . traMADol (ULTRAM) 50 MG tablet Take 1 tablet (50 mg total) by  mouth every 12 (twelve) hours as needed. 45 tablet 1   No current facility-administered medications for this visit.     Objective:   Physical Exam BP 107/75 mmHg  Pulse 67  Temp(Src) 98.4 F (36.9 C) (Oral)  Ht 5\' 5"  (1.651 m)  Wt 158 lb 11.2 oz (71.986 kg)  BMI 26.41 kg/m2 Gen:  Alert, cooperative patient. NAD HEENT: EOMI,  MMM.  Face with scattered closed comedones across nose and cheeks, forehead. Cardiac:  Regular rate and rhythm Pulm:  Clear to auscultation bilaterally  Ext: No edema currently BL LE's   No results found for this or any previous visit (from the past 72 hour(s)).

## 2014-02-23 NOTE — Patient Instructions (Addendum)
I will refer you to a dermatologist for your acne.    It was good to see you again!

## 2014-02-23 NOTE — Telephone Encounter (Signed)
CSW left a message for pt via [employer]. Pt has an appointment with the optometrist for an eye exam and glasses on March 12, 2013 at 11am. Pt is to arrive 20 minutes early with someone who speaks [place]. The location is. Dr. Webb Laws [address]. [place], [place]  CSW also left a message for pts brother informing him of the above.   Hunt Oris, MSW, [place]

## 2014-02-25 DIAGNOSIS — R600 Localized edema: Secondary | ICD-10-CM | POA: Insufficient documentation

## 2014-02-25 NOTE — Assessment & Plan Note (Signed)
Still on Tetracycline. Unclear if this is really acne based on age and no improvement. Will refer to dermatology today.

## 2014-02-25 NOTE — Assessment & Plan Note (Signed)
We reviewed extensively via intepreter fact she needs to call pharmacist when her medications are close to running out. (Brother speaks English and can Therapist, music). She did not realize she had to call, states she assumed her medicines "just showed up." Has 3 refills left on her Bactrim.  4 pills left in bottle she brings today.  Encouraged her to call as soon as she got home.

## 2014-02-25 NOTE — Assessment & Plan Note (Signed)
Still working and trying to get her referred to [facility].  Has been difficult communicating with her via the phone.  Hunt Oris helping with this.

## 2014-02-25 NOTE — Assessment & Plan Note (Signed)
No red flags.  No edema today.  Recommended to wear tight stockings vs obtaining compression stockings to wear at work if she can afford them  FU if no improvement, worsening.  Given warning/red flags.

## 2014-03-19 ENCOUNTER — Telehealth: Payer: Self-pay | Admitting: Clinical

## 2014-03-19 NOTE — Telephone Encounter (Signed)
CSW has cancelled pts appointment on 03/22/14 with the Optometrist since CSW could not confirm she could attend.  Hunt Oris, MSW, [place]

## 2014-03-19 NOTE — Telephone Encounter (Signed)
CSW left a message for pt with the use of [employer]. CSW also left a message for pts brother. CSW has been unable to reach pt to inform her of her appointment on Monday 03/22/14 for her vision exam.  CSW will cancel the appointment if CSW does not hear back from pt by 12pm today.  Hunt Oris, MSW, [place]

## 2014-03-23 ENCOUNTER — Encounter: Payer: Self-pay | Admitting: Clinical

## 2014-03-23 NOTE — Progress Notes (Signed)
Pt arrived at [facility] to see [place] regarding her Optometrist appointment. CSW informed pt that it was cancelled as CSW was unable to reach pt or her brother. CSW provided pt with the address and bus route to Dr. Irven Shelling office. Pt has been rescheduled for 04/08/14 at 10:30am. Pt very appreciative.  Hunt Oris, MSW, [place]

## 2014-04-20 ENCOUNTER — Other Ambulatory Visit: Payer: Self-pay | Admitting: *Deleted

## 2014-04-20 ENCOUNTER — Ambulatory Visit (INDEPENDENT_AMBULATORY_CARE_PROVIDER_SITE_OTHER): Payer: Medicaid Other | Admitting: Internal Medicine

## 2014-04-20 VITALS — BP 125/86 | HR 77 | Temp 98.3°F | Wt 164.0 lb

## 2014-04-20 DIAGNOSIS — H109 Unspecified conjunctivitis: Secondary | ICD-10-CM

## 2014-04-20 DIAGNOSIS — B20 Human immunodeficiency virus [HIV] disease: Secondary | ICD-10-CM

## 2014-04-21 MED ORDER — TETRACYCLINE HCL 250 MG PO CAPS: 250.0000 mg | ORAL_CAPSULE | Freq: Two times a day (BID) | ORAL | Status: DC

## 2014-04-21 NOTE — Progress Notes (Signed)
Patient ID: Carolyn Williamson, female   DOB: 10-29-1970, 44 y.o.   MRN: [id number]       Patient ID: Carolyn Williamson, female   DOB: 1970/12/18, 44 y.o.   MRN: [id number]  HPI Carolyn Williamson is a 44 yo french speaking woman from [place], with HIV disease, CD 4 count of 120/VL<20 on stribild and bactrim OI prophylaxis. She is distraught today finding out that she has her medicaid and is no longer insured. She also suscribes to food insecurities, and her payments/bills exceeding what she brings in as income from her $8/hr job. She is asking for assistance in anyway. In regards to her health, she denies fever, chills, nightsweats, diarrhea, rash. She reports her eyes being bloodshot but no pain or associated tenderness  Outpatient Encounter Prescriptions as of 04/20/2014  Medication Sig  . cetirizine (ZYRTEC) 10 MG tablet Take 1 tablet (10 mg total) by mouth daily.  Marland Kitchen elvitegravir-cobicistat-emtricitabine-tenofovir (STRIBILD) 150-150-200-300 MG TABS tablet Take 1 tablet by mouth daily with breakfast.  . ibuprofen (ADVIL,MOTRIN) 600 MG tablet Take 1 tablet (600 mg total) by mouth every 8 (eight) hours as needed.  . Naphazoline-Glycerin-Zinc Sulf (CLEAR EYES MAXIMUM ITCHY EYE) 0.012-0.25-0.25 % SOLN Apply 2 drops to eye daily.  Marland Kitchen omeprazole (PRILOSEC) 40 MG capsule Take 1 capsule (40 mg total) by mouth daily.  Marland Kitchen sulfamethoxazole-trimethoprim (BACTRIM) 400-80 MG per tablet Take 1 tablet by mouth daily.  . [DISCONTINUED] tetracycline (ACHROMYCIN,SUMYCIN) 250 MG capsule Take 1 capsule (250 mg total) by mouth 2 (two) times daily before a meal.  . Adapalene (DIFFERIN) 0.1 % LOTN Apply small amount to face at night (Patient not taking: Reported on 04/20/2014)  . traMADol (ULTRAM) 50 MG tablet Take 1 tablet (50 mg total) by mouth every 12 (twelve) hours as needed. (Patient not taking: Reported on 04/20/2014)     Patient Active Problem List   Diagnosis Date Noted  . Bilateral leg edema 02/25/2014  . Abdominal muscle  strain 01/29/2014  . Premature menopause 01/06/2014  . Seasonal allergies 01/06/2014  . Low back pain 11/30/2013  . Abdominal pain, epigastric 10/21/2013  . Myopia 10/21/2013  . Dental caries 10/21/2013  . Immigrant with language difficulty 09/14/2013  . Refugee health examination 09/14/2013  . HIV disease 09/14/2013  . Acne vulgaris 09/14/2013     There are no preventive care reminders to display for this patient.   Review of Systems 12 point ros reviewed in hpi, + dry eyes Physical Exam   BP 125/86 mmHg  Pulse 77  Temp(Src) 98.3 F (36.8 C) (Oral)  Wt 164 lb (74.39 kg) Physical Exam  Constitutional:  oriented to person, place, and time. appears well-developed and well-nourished. No distress.  HENT: bilateral conjunctivitis Mouth/Throat: Oropharynx is clear and moist. No oropharyngeal exudate.  Cardiovascular: Normal rate, regular rhythm and normal heart sounds. Exam reveals no gallop and no friction rub.  No murmur heard.  Pulmonary/Chest: Effort normal and breath sounds normal. No respiratory distress.  has no wheezes.  Abdominal: Soft. Bowel sounds are normal.  exhibits no distension. There is no tenderness.  Lymphadenopathy: no cervical adenopathy.  Neurological: alert and oriented to person, place, and time.  Skin: Skin is warm and dry. No rash noted. No erythema.  Psychiatric: a normal mood and affect. behavior is normal.   Lab Results  Component Value Date   CD4TCELL 20* 01/18/2014   Lab Results  Component Value Date   CD4TABS 120* 01/18/2014   CD4TABS 120* 12/02/2013   Lab Results  Component Value  Date   HIV1RNAQUANT <20 01/18/2014   Lab Results  Component Value Date   HEPBSAB NEG 10/06/2013   No results found for: RPR  CBC Lab Results  Component Value Date   WBC 1.8* 01/18/2014   RBC 4.05 01/18/2014   HGB 12.4 01/18/2014   HCT 34.7* 01/18/2014   PLT 251 01/18/2014   MCV 85.7 01/18/2014   MCH 30.6 01/18/2014   MCHC 35.7 01/18/2014   RDW  13.4 01/18/2014   LYMPHSABS 0.6* 01/18/2014   MONOABS 0.3 01/18/2014   EOSABS 0.3 01/18/2014   BASOSABS 0.0 01/18/2014   BMET Lab Results  Component Value Date   NA 137 01/18/2014   K 4.4 01/18/2014   CL 103 01/18/2014   CO2 28 01/18/2014   GLUCOSE 87 01/18/2014   BUN 10 01/18/2014   CREATININE 0.71 01/18/2014   CALCIUM 9.1 01/18/2014   GFRNONAA >89 01/18/2014   GFRAA >89 01/18/2014     Assessment and Plan   hiv disease = will continue with stribild as well as bactrim for oi prophylaxis. Will give her a bottle of stribild to bridge her until she gets adap application which we will apply for this week  Food insecurity = we will have her meet with [facility] counselor for additional services  Conjunctivitis = it does not look like viral conjunctivitis but perhaps from exposure to environment when she lived in [place]. It does not appear anyworse than at last visit. Not painful. Recommend to continue with eye drops.

## 2014-04-22 ENCOUNTER — Other Ambulatory Visit: Payer: Self-pay | Admitting: *Deleted

## 2014-04-22 DIAGNOSIS — B20 Human immunodeficiency virus [HIV] disease: Secondary | ICD-10-CM

## 2014-04-22 MED ORDER — ELVITEG-COBIC-EMTRICIT-TENOFDF 150-150-200-300 MG PO TABS
1.0000 | ORAL_TABLET | Freq: Every day | ORAL | Status: DC
Start: 2014-04-22 — End: 2014-05-17

## 2014-05-17 ENCOUNTER — Other Ambulatory Visit: Payer: Self-pay | Admitting: *Deleted

## 2014-05-17 DIAGNOSIS — B20 Human immunodeficiency virus [HIV] disease: Secondary | ICD-10-CM

## 2014-05-17 MED ORDER — ELVITEG-COBIC-EMTRICIT-TENOFDF 150-150-200-300 MG PO TABS: 1.0000 | ORAL_TABLET | Freq: Every day | ORAL | Status: DC

## 2014-05-26 ENCOUNTER — Other Ambulatory Visit: Payer: Self-pay | Admitting: *Deleted

## 2014-05-26 DIAGNOSIS — B20 Human immunodeficiency virus [HIV] disease: Secondary | ICD-10-CM

## 2014-05-26 MED ORDER — SULFAMETHOXAZOLE-TRIMETHOPRIM 400-80 MG PO TABS
1.0000 | ORAL_TABLET | Freq: Every day | ORAL | Status: DC
Start: 2014-05-26 — End: 2015-03-01

## 2014-06-10 ENCOUNTER — Ambulatory Visit (INDEPENDENT_AMBULATORY_CARE_PROVIDER_SITE_OTHER): Payer: Medicaid Other | Admitting: Internal Medicine

## 2014-06-10 ENCOUNTER — Encounter: Payer: Self-pay | Admitting: Internal Medicine

## 2014-06-10 VITALS — BP 120/73 | HR 71 | Temp 97.7°F | Wt 170.0 lb

## 2014-06-10 DIAGNOSIS — K219 Gastro-esophageal reflux disease without esophagitis: Secondary | ICD-10-CM

## 2014-06-10 DIAGNOSIS — B20 Human immunodeficiency virus [HIV] disease: Secondary | ICD-10-CM

## 2014-06-10 DIAGNOSIS — H109 Unspecified conjunctivitis: Secondary | ICD-10-CM | POA: Diagnosis not present

## 2014-06-10 LAB — COMPLETE METABOLIC PANEL WITH GFR
ALK PHOS: 104 U/L (ref 39–117)
ALT: 12 U/L (ref 0–35)
AST: 20 U/L (ref 0–37)
Albumin: 3.8 g/dL (ref 3.5–5.2)
BILIRUBIN TOTAL: 0.3 mg/dL (ref 0.2–1.2)
BUN: 15 mg/dL (ref 6–23)
CO2: 23 mEq/L (ref 19–32)
Calcium: 8.7 mg/dL (ref 8.4–10.5)
Chloride: 107 mEq/L (ref 96–112)
Creat: 0.62 mg/dL (ref 0.50–1.10)
GFR, Est African American: >89 mL/min
GLUCOSE: 89 mg/dL (ref 70–99)
Potassium: 4.6 mEq/L (ref 3.5–5.3)
Sodium: 138 mEq/L (ref 135–145)
TOTAL PROTEIN: 7.9 g/dL (ref 6.0–8.3)

## 2014-06-10 MED ORDER — OMEPRAZOLE 20 MG PO CPDR: 20.0000 mg | DELAYED_RELEASE_CAPSULE | Freq: Every day | ORAL | Status: DC

## 2014-06-10 MED ORDER — NAPHAZOLINE-GLYCERIN-ZINC SULF 0.012-0.25-0.25 % OP SOLN: 2.0000 [drp] | Freq: Every day | OPHTHALMIC | Status: DC

## 2014-06-11 LAB — T-HELPER CELL (CD4) - (RCID CLINIC ONLY)
CD4 T CELL HELPER: 15 % — AB (ref 33–55)
CD4 T Cell Abs: 110 /uL — ABNORMAL LOW (ref 400–2700)

## 2014-06-11 LAB — CBC WITH DIFFERENTIAL/PLATELET
Basophils Absolute: 0 10*3/uL (ref 0.0–0.1)
Basophils Relative: 1 % (ref 0–1)
EOS PCT: 10 % — AB (ref 0–5)
Eosinophils Absolute: 0.2 10*3/uL (ref 0.0–0.7)
HCT: 34.8 % — ABNORMAL LOW (ref 36.0–46.0)
Hemoglobin: 11.9 g/dL — ABNORMAL LOW (ref 12.0–15.0)
LYMPHS ABS: 0.7 10*3/uL (ref 0.7–4.0)
LYMPHS PCT: 37 % (ref 12–46)
MCH: 30.4 pg (ref 26.0–34.0)
MCHC: 34.2 g/dL (ref 30.0–36.0)
MCV: 88.8 fL (ref 78.0–100.0)
MPV: 9.4 fL (ref 8.6–12.4)
Monocytes Absolute: 0.2 10*3/uL (ref 0.1–1.0)
Monocytes Relative: 12 % (ref 3–12)
NEUTROS PCT: 40 % — AB (ref 43–77)
Neutro Abs: 0.8 10*3/uL — ABNORMAL LOW (ref 1.7–7.7)
PLATELETS: 247 10*3/uL (ref 150–400)
RBC: 3.92 MIL/uL (ref 3.87–5.11)
RDW: 14.2 % (ref 11.5–15.5)
WBC: 1.9 10*3/uL — ABNORMAL LOW (ref 4.0–10.5)

## 2014-06-12 LAB — HIV-1 RNA QUANT-NO REFLEX-BLD

## 2014-06-14 NOTE — Progress Notes (Signed)
Patient ID: Carolyn Williamson, female   DOB: 06-01-1970, 44 y.o.   MRN: [id number]       Patient ID: Carolyn Williamson, female   DOB: 1970/09/29, 44 y.o.   MRN: [id number]  HPI 44yo F originally from [place], french speaking with advanced HIV disease. CD 4 count of 110/VL<20, on stribild plus bactrim for OI proph  She reports that at our last visit, she had some relief with eye drops and would like a refill. She feels that her eyes are often itching and tearful. No painful vision. Denies any discharge from eyes in the morning  She also reports having epigastric chest pain. Not with exertion. Occasionally associated with eating. She is unable to tell me any consistent pattern associated with it. Sometimes she has belching and abdominal distention with the pain. Has not taken omeprazole in the past for it.  Outpatient Encounter Prescriptions as of 06/10/2014  Medication Sig  . cetirizine (ZYRTEC) 10 MG tablet Take 1 tablet (10 mg total) by mouth daily.  Marland Kitchen elvitegravir-cobicistat-emtricitabine-tenofovir (STRIBILD) 150-150-200-300 MG TABS tablet Take 1 tablet by mouth daily with breakfast.  . ibuprofen (ADVIL,MOTRIN) 600 MG tablet Take 1 tablet (600 mg total) by mouth every 8 (eight) hours as needed.  . sulfamethoxazole-trimethoprim (BACTRIM) 400-80 MG per tablet Take 1 tablet by mouth daily.  . Adapalene (DIFFERIN) 0.1 % LOTN Apply small amount to face at night (Patient not taking: Reported on 04/20/2014)  . Naphazoline-Glycerin-Zinc Sulf (CLEAR EYES MAXIMUM ITCHY EYE) 0.012-0.25-0.25 % SOLN Apply 2 drops to eye daily.  Marland Kitchen omeprazole (PRILOSEC) 20 MG capsule Take 1 capsule (20 mg total) by mouth daily. Prendre 2 heures avant stribild  . tetracycline (ACHROMYCIN,SUMYCIN) 250 MG capsule Take 1 capsule (250 mg total) by mouth 2 (two) times daily before a meal.  . traMADol (ULTRAM) 50 MG tablet Take 1 tablet (50 mg total) by mouth every 12 (twelve) hours as needed. (Patient not taking: Reported on 04/20/2014)    . [DISCONTINUED] Naphazoline-Glycerin-Zinc Sulf (CLEAR EYES MAXIMUM ITCHY EYE) 0.012-0.25-0.25 % SOLN Apply 2 drops to eye daily. (Patient not taking: Reported on 06/10/2014)  . [DISCONTINUED] omeprazole (PRILOSEC) 40 MG capsule Take 1 capsule (40 mg total) by mouth daily.     Patient Active Problem List   Diagnosis Date Noted  . Bilateral leg edema 02/25/2014  . Abdominal muscle strain 01/29/2014  . Premature menopause 01/06/2014  . Seasonal allergies 01/06/2014  . Low back pain 11/30/2013  . Abdominal pain, epigastric 10/21/2013  . Myopia 10/21/2013  . Dental caries 10/21/2013  . Immigrant with language difficulty 09/14/2013  . Refugee health examination 09/14/2013  . HIV disease 09/14/2013  . Acne vulgaris 09/14/2013     There are no preventive care reminders to display for this patient.   Review of Systems 10 point ros reviewed, please see comments in hpi Physical Exam   BP 120/73 mmHg  Pulse 71  Temp(Src) 97.7 F (36.5 C) (Oral)  Wt 170 lb (77.111 kg)  Constitutional:  oriented to person, place, and time. appears well-developed and well-nourished. No distress.  HENT:  Mouth/Throat: Oropharynx is clear and moist. No oropharyngeal exudate.  Cardiovascular: Normal rate, regular rhythm and normal heart sounds. Exam reveals no gallop and no friction rub.  No murmur heard.  Pulmonary/Chest: Effort normal and breath sounds normal. No respiratory distress.  has no wheezes.  Abdominal: Soft. Bowel sounds are normal.  exhibits no distension. There is no tenderness.  Lymphadenopathy: no cervical adenopathy.  Neurological: alert and oriented to person,  place, and time.  Skin: Skin is warm and dry. No rash noted. No erythema.  Psychiatric: a normal mood and affect.  behavior is normal.   Lab Results  Component Value Date   CD4TCELL 15* 06/10/2014   Lab Results  Component Value Date   CD4TABS 110* 06/10/2014   CD4TABS 120* 01/18/2014   CD4TABS 120* 12/02/2013   Lab  Results  Component Value Date   HIV1RNAQUANT <20 06/10/2014   Lab Results  Component Value Date   HEPBSAB NEG 10/06/2013   No results found for: RPR  CBC Lab Results  Component Value Date   WBC 1.9* 06/10/2014   RBC 3.92 06/10/2014   HGB 11.9* 06/10/2014   HCT 34.8* 06/10/2014   PLT 247 06/10/2014   MCV 88.8 06/10/2014   MCH 30.4 06/10/2014   MCHC 34.2 06/10/2014   RDW 14.2 06/10/2014   LYMPHSABS 0.7 06/10/2014   MONOABS 0.2 06/10/2014   EOSABS 0.2 06/10/2014   BASOSABS 0.0 06/10/2014   BMET Lab Results  Component Value Date   NA 138 06/10/2014   K 4.6 06/10/2014   CL 107 06/10/2014   CO2 23 06/10/2014   GLUCOSE 89 06/10/2014   BUN 15 06/10/2014   CREATININE 0.62 06/10/2014   CALCIUM 8.7 06/10/2014   GFRNONAA >89 06/10/2014   GFRAA >89 06/10/2014     Assessment and Plan  hiv disease = has good virologic response. Continue with stribild. Anticipate her next refill to change to genvoya  Allergic conjunctivitis = will refill her eye drops for symptomatic relief. Will need to see if can find her to have ophtho evaluation in case she needs glasses.  Presumed GERD as source of chest pain = will do a trial of omeprazole to see if it helps her symptoms

## 2014-06-22 ENCOUNTER — Ambulatory Visit: Payer: Medicaid Other | Admitting: Internal Medicine

## 2014-07-15 ENCOUNTER — Ambulatory Visit (INDEPENDENT_AMBULATORY_CARE_PROVIDER_SITE_OTHER): Payer: Self-pay | Admitting: Internal Medicine

## 2014-07-15 ENCOUNTER — Encounter: Payer: Self-pay | Admitting: Internal Medicine

## 2014-07-15 VITALS — BP 113/75 | HR 77 | Temp 97.5°F | Wt 168.5 lb

## 2014-07-15 DIAGNOSIS — G629 Polyneuropathy, unspecified: Secondary | ICD-10-CM

## 2014-07-15 DIAGNOSIS — B20 Human immunodeficiency virus [HIV] disease: Secondary | ICD-10-CM

## 2014-07-15 MED ORDER — PREGABALIN 50 MG PO CAPS: 50.0000 mg | ORAL_CAPSULE | Freq: Every day | ORAL | Status: DC | PRN

## 2014-07-15 MED ORDER — ELVITEG-COBIC-EMTRICIT-TENOFAF 150-150-200-10 MG PO TABS: 1.0000 | ORAL_TABLET | Freq: Every day | ORAL | Status: DC

## 2014-07-15 NOTE — Progress Notes (Signed)
Patient ID: Carolyn Williamson, female   DOB: 1970-07-14, 44 y.o.   MRN: [id number]       Patient ID: Carolyn Williamson, female   DOB: 10/14/1970, 44 y.o.   MRN: [id number]  HPI Carolyn Williamson is a 44yo F from [place] with HIV disease. She has been taking her stribild and bactrim as directed. CD 4 count of 110/VL<20 (march 2016). She states that she has noticed having significant weight gain since starting stribild. She is the heaviest she has been in her life. She is wondering what she could do to lose weight. In addition, she states that she has noticed having sensation of needle pricks to hands and left leg intermittently, not lasting more than 5 min. Sensation in legs is after being on her feet for numerous hours c/w intermittent peripheral neuropathy.  Outpatient Encounter Prescriptions as of 07/15/2014  Medication Sig  . omeprazole (PRILOSEC) 20 MG capsule Take 1 capsule (20 mg total) by mouth daily. Prendre 2 heures avant stribild  . sulfamethoxazole-trimethoprim (BACTRIM) 400-80 MG per tablet Take 1 tablet by mouth daily.  . [DISCONTINUED] elvitegravir-cobicistat-emtricitabine-tenofovir (STRIBILD) 150-150-200-300 MG TABS tablet Take 1 tablet by mouth daily with breakfast.  . elvitegravir-cobicistat-emtricitabine-tenofovir (GENVOYA) 150-150-200-10 MG TABS tablet Take 1 tablet by mouth daily with breakfast.  . pregabalin (LYRICA) 50 MG capsule Take 1 capsule (50 mg total) by mouth daily as needed. Prendre 1 capsule par jour si necessaire  . [DISCONTINUED] Adapalene (DIFFERIN) 0.1 % LOTN Apply small amount to face at night (Patient not taking: Reported on 04/20/2014)  . [DISCONTINUED] cetirizine (ZYRTEC) 10 MG tablet Take 1 tablet (10 mg total) by mouth daily. (Patient not taking: Reported on 07/15/2014)  . [DISCONTINUED] ibuprofen (ADVIL,MOTRIN) 600 MG tablet Take 1 tablet (600 mg total) by mouth every 8 (eight) hours as needed. (Patient not taking: Reported on 07/15/2014)  . [DISCONTINUED]  Naphazoline-Glycerin-Zinc Sulf (CLEAR EYES MAXIMUM ITCHY EYE) 0.012-0.25-0.25 % SOLN Apply 2 drops to eye daily. (Patient not taking: Reported on 07/15/2014)  . [DISCONTINUED] tetracycline (ACHROMYCIN,SUMYCIN) 250 MG capsule Take 1 capsule (250 mg total) by mouth 2 (two) times daily before a meal.  . [DISCONTINUED] traMADol (ULTRAM) 50 MG tablet Take 1 tablet (50 mg total) by mouth every 12 (twelve) hours as needed. (Patient not taking: Reported on 04/20/2014)   No facility-administered encounter medications on file as of 07/15/2014.     Patient Active Problem List   Diagnosis Date Noted  . Bilateral leg edema 02/25/2014  . Abdominal muscle strain 01/29/2014  . Premature menopause 01/06/2014  . Seasonal allergies 01/06/2014  . Low back pain 11/30/2013  . Abdominal pain, epigastric 10/21/2013  . Myopia 10/21/2013  . Dental caries 10/21/2013  . Immigrant with language difficulty 09/14/2013  . Refugee health examination 09/14/2013  . HIV disease 09/14/2013  . Acne vulgaris 09/14/2013     There are no preventive care reminders to display for this patient.   Review of Systems 10 point ros is negative except for weight gain and neuropathy to leg and hands Physical Exam   BP 113/75 mmHg  Pulse 77  Temp(Src) 97.5 F (36.4 C) (Oral)  Wt 168 lb 8 oz (76.431 kg) Physical Exam  Constitutional:  oriented to person, place, and time. appears well-developed and well-nourished. No distress.  HENT: [place]/AT, PERRLA, no scleral icterus Mouth/Throat: Oropharynx is clear and moist. No oropharyngeal exudate.  Cardiovascular: Normal rate, regular rhythm and normal heart sounds. Exam reveals no gallop and no friction rub.  No murmur heard.  Pulmonary/Chest: Effort  normal and breath sounds normal. No respiratory distress.  has no wheezes.  Neck = supple, no nuchal rigidity Lymphadenopathy: no cervical adenopathy. No axillary adenopathy Neurological: alert and oriented to person, place, and time.  Skin:  Skin is warm and dry. No rash noted. No erythema.  Psychiatric: a normal mood and affect.  behavior is normal.   Lab Results  Component Value Date   CD4TCELL 15* 06/10/2014   Lab Results  Component Value Date   CD4TABS 110* 06/10/2014   CD4TABS 120* 01/18/2014   CD4TABS 120* 12/02/2013   Lab Results  Component Value Date   HIV1RNAQUANT <20 06/10/2014   Lab Results  Component Value Date   HEPBSAB NEG 10/06/2013   No results found for: RPR  CBC Lab Results  Component Value Date   WBC 1.9* 06/10/2014   RBC 3.92 06/10/2014   HGB 11.9* 06/10/2014   HCT 34.8* 06/10/2014   PLT 247 06/10/2014   MCV 88.8 06/10/2014   MCH 30.4 06/10/2014   MCHC 34.2 06/10/2014   RDW 14.2 06/10/2014   LYMPHSABS 0.7 06/10/2014   MONOABS 0.2 06/10/2014   EOSABS 0.2 06/10/2014   BASOSABS 0.0 06/10/2014   BMET Lab Results  Component Value Date   NA 138 06/10/2014   K 4.6 06/10/2014   CL 107 06/10/2014   CO2 23 06/10/2014   GLUCOSE 89 06/10/2014   BUN 15 06/10/2014   CREATININE 0.62 06/10/2014   CALCIUM 8.7 06/10/2014   GFRNONAA >89 06/10/2014   GFRAA >89 06/10/2014     Assessment and Plan  hiv disease = we will switch her to genvoya. Check labs in 2-3 months  oi proph = continue with bactrim prophylaxis  Weight gain = could be related to meds but mentioned it is likely due to her body now under good virologic control. Recommend to increase exercise and decrease sugar/carb intake

## 2014-09-07 ENCOUNTER — Other Ambulatory Visit: Payer: Self-pay | Admitting: Internal Medicine

## 2014-10-19 ENCOUNTER — Ambulatory Visit: Payer: Self-pay

## 2014-10-19 ENCOUNTER — Encounter: Payer: Self-pay | Admitting: Internal Medicine

## 2014-10-19 ENCOUNTER — Ambulatory Visit (INDEPENDENT_AMBULATORY_CARE_PROVIDER_SITE_OTHER): Payer: Self-pay | Admitting: Internal Medicine

## 2014-10-19 VITALS — BP 105/64 | HR 61 | Temp 98.2°F | Wt 169.0 lb

## 2014-10-19 DIAGNOSIS — J302 Other seasonal allergic rhinitis: Secondary | ICD-10-CM

## 2014-10-19 DIAGNOSIS — B20 Human immunodeficiency virus [HIV] disease: Secondary | ICD-10-CM

## 2014-10-19 DIAGNOSIS — R630 Anorexia: Secondary | ICD-10-CM

## 2014-10-19 LAB — COMPLETE METABOLIC PANEL WITH GFR
ALK PHOS: 104 U/L (ref 33–115)
ALT: 10 U/L (ref 6–29)
AST: 16 U/L (ref 10–30)
Albumin: 3.6 g/dL (ref 3.6–5.1)
BUN: 11 mg/dL (ref 7–25)
CO2: 21 mmol/L (ref 20–31)
Calcium: 8.5 mg/dL — ABNORMAL LOW (ref 8.6–10.2)
Chloride: 106 mmol/L (ref 98–110)
Creat: 0.65 mg/dL (ref 0.50–1.10)
GFR, Est African American: >89 mL/min (ref >=60)
GFR, Est Non African American: >89 mL/min (ref >=60)
GLUCOSE: 94 mg/dL (ref 65–99)
Potassium: 4.5 mmol/L (ref 3.5–5.3)
SODIUM: 139 mmol/L (ref 135–146)
Total Bilirubin: 0.4 mg/dL (ref 0.2–1.2)
Total Protein: 7.6 g/dL (ref 6.1–8.1)

## 2014-10-19 LAB — CBC WITH DIFFERENTIAL/PLATELET
BASOS ABS: 0 10*3/uL (ref 0.0–0.1)
Basophils Relative: 0 % (ref 0–1)
Eosinophils Absolute: 0.3 10*3/uL (ref 0.0–0.7)
Eosinophils Relative: 11 % — ABNORMAL HIGH (ref 0–5)
HEMATOCRIT: 33.8 % — AB (ref 36.0–46.0)
HEMOGLOBIN: 11.7 g/dL — AB (ref 12.0–15.0)
Lymphocytes Relative: 27 % (ref 12–46)
Lymphs Abs: 0.8 10*3/uL (ref 0.7–4.0)
MCH: 30.8 pg (ref 26.0–34.0)
MCHC: 34.6 g/dL (ref 30.0–36.0)
MCV: 88.9 fL (ref 78.0–100.0)
MONO ABS: 0.4 10*3/uL (ref 0.1–1.0)
MPV: 9.4 fL (ref 8.6–12.4)
Monocytes Relative: 12 % (ref 3–12)
NEUTROS ABS: 1.6 10*3/uL — AB (ref 1.7–7.7)
NEUTROS PCT: 50 % (ref 43–77)
PLATELETS: 244 10*3/uL (ref 150–400)
RBC: 3.8 MIL/uL — AB (ref 3.87–5.11)
RDW: 14.9 % (ref 11.5–15.5)
WBC: 3.1 10*3/uL — AB (ref 4.0–10.5)

## 2014-10-19 NOTE — Progress Notes (Signed)
Rfv: follow up on hiv disease Subjective:    Patient ID: Carolyn Williamson, female    DOB: [birth year], 44 y.o.   MRN: [id number]  HPI 44yo  F with HIV disease, CD 4 count 110/VL<20 on genvoya plus bactrim for oi proph. She states that she is taking her hiv medications as directed. She notices that she has runny nose, occ nasal congestion, and itchy eyes. She is hoping for some medications to alleviate her symptoms. She is just getting over a uri. She states that she has occasional headache from coughing. She is now feeling better than a few days ago.  Current Outpatient Prescriptions on File Prior to Visit  Medication Sig Dispense Refill  . elvitegravir-cobicistat-emtricitabine-tenofovir (GENVOYA) 150-150-200-10 MG TABS tablet Take 1 tablet by mouth daily with breakfast. 30 tablet 11  . LYRICA 50 MG capsule TAKE ONE CAPSULE BY MOUTH EVERY DAY 30 capsule 1  . omeprazole (PRILOSEC) 20 MG capsule Take 1 capsule (20 mg total) by mouth daily. Prendre 2 heures avant stribild 30 capsule 11  . sulfamethoxazole-trimethoprim (BACTRIM,SEPTRA) 400-80 MG per tablet TAKE 1 TABLET BY MOUTH DAILY 30 tablet 2   No current facility-administered medications on file prior to visit.   Active Ambulatory Problems    Diagnosis Date Noted  . Immigrant with language difficulty 09/14/2013  . Refugee health examination 09/14/2013  . HIV disease 09/14/2013  . Acne vulgaris 09/14/2013  . Abdominal pain, epigastric 10/21/2013  . Myopia 10/21/2013  . Dental caries 10/21/2013  . Low back pain 11/30/2013  . Premature menopause 01/06/2014  . Seasonal allergies 01/06/2014  . Abdominal muscle strain 01/29/2014  . Bilateral leg edema 02/25/2014   Resolved Ambulatory Problems    Diagnosis Date Noted  . No Resolved Ambulatory Problems   Past Medical History  Diagnosis Date  . HIV (human immunodeficiency virus infection)   . Fibroids    Social History  Substance Use Topics  . Smoking status: Never Smoker   .  Smokeless tobacco: None  . Alcohol Use: No     Review of Systems 10 point ros is negative except for what is mentioned in hpi. She reports loss of appetite but no weight loss. 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 appears well-developed and well-nourished. No distress.  HENT: [place]/AT, PERRLA, no scleral icterus Mouth/Throat: Oropharynx is clear and moist. No oropharyngeal exudate.  Cardiovascular: Normal rate, regular rhythm and normal heart sounds. Exam reveals no gallop and no friction rub.  No murmur heard.  Pulmonary/Chest: Effort normal and breath sounds normal. No respiratory distress.  has no wheezes.  Neck = supple,  Lymphadenopathy: no cervical adenopathy. No axillary adenopathy Neurological: alert and oriented to person, place, and time.  Skin: Skin is warm and dry. No rash noted. No erythema.  Psychiatric: a normal mood and affect.  behavior is normal.         Assessment & Plan:  hiv disease = cd 4 count remains low, likely reflection of starting ART with advanced hiv disease. she has good viral load suppression. We will continue on genvoya, we will check labs, cd 4 count, viral load, bmp and cbc  oi proph = continue with bactrim since CD 4 count <200/ AIDS  Seasonal  allergies = will start zyrtec to use as needed to help her symptoms  Loss of appetite = no associated weight loss, wonder if working 2nd shift has impact on her sleep and eating habits. Asked her to keep a week long food diary to see how much she is eating. Can consider giving her appetite stimulant if she is losing weight.

## 2014-10-20 LAB — T-HELPER CELL (CD4) - (RCID CLINIC ONLY)
CD4 % Helper T Cell: 17 % — ABNORMAL LOW (ref 33–55)
CD4 T Cell Abs: 130 /uL — ABNORMAL LOW (ref 400–2700)

## 2014-10-21 LAB — HIV-1 RNA QUANT-NO REFLEX-BLD
HIV 1 RNA Quant: <20 copies/mL (ref <20)
HIV-1 RNA Quant, Log: <1.3 {Log} (ref <1.30)

## 2014-11-08 ENCOUNTER — Other Ambulatory Visit: Payer: Self-pay | Admitting: Internal Medicine

## 2014-12-29 ENCOUNTER — Other Ambulatory Visit: Payer: Self-pay | Admitting: Internal Medicine

## 2014-12-29 DIAGNOSIS — G63 Polyneuropathy in diseases classified elsewhere: Principal | ICD-10-CM

## 2014-12-29 DIAGNOSIS — B20 Human immunodeficiency virus [HIV] disease: Secondary | ICD-10-CM

## 2014-12-31 ENCOUNTER — Encounter: Payer: Self-pay | Admitting: *Deleted

## 2015-01-11 ENCOUNTER — Other Ambulatory Visit: Payer: Self-pay

## 2015-01-11 DIAGNOSIS — B20 Human immunodeficiency virus [HIV] disease: Secondary | ICD-10-CM

## 2015-01-11 DIAGNOSIS — Z113 Encounter for screening for infections with a predominantly sexual mode of transmission: Secondary | ICD-10-CM

## 2015-01-11 LAB — CBC WITH DIFFERENTIAL/PLATELET
Basophils Absolute: 0 10*3/uL (ref 0.0–0.1)
Basophils Relative: 1 % (ref 0–1)
Eosinophils Absolute: 0.5 10*3/uL (ref 0.0–0.7)
Eosinophils Relative: 21 % — ABNORMAL HIGH (ref 0–5)
HEMATOCRIT: 35.7 % — AB (ref 36.0–46.0)
HEMOGLOBIN: 12 g/dL (ref 12.0–15.0)
LYMPHS PCT: 34 % (ref 12–46)
Lymphs Abs: 0.9 10*3/uL (ref 0.7–4.0)
MCH: 30.5 pg (ref 26.0–34.0)
MCHC: 33.6 g/dL (ref 30.0–36.0)
MCV: 90.6 fL (ref 78.0–100.0)
MONO ABS: 0.3 10*3/uL (ref 0.1–1.0)
MONOS PCT: 13 % — AB (ref 3–12)
MPV: 9.9 fL (ref 8.6–12.4)
NEUTROS ABS: 0.8 10*3/uL — AB (ref 1.7–7.7)
NEUTROS PCT: 31 % — AB (ref 43–77)
Platelets: 218 10*3/uL (ref 150–400)
RBC: 3.94 MIL/uL (ref 3.87–5.11)
RDW: 13.5 % (ref 11.5–15.5)
WBC: 2.6 10*3/uL — AB (ref 4.0–10.5)

## 2015-01-11 LAB — COMPLETE METABOLIC PANEL WITH GFR
ALBUMIN: 3.4 g/dL — AB (ref 3.6–5.1)
ALK PHOS: 85 U/L (ref 33–115)
ALT: 9 U/L (ref 6–29)
AST: 18 U/L (ref 10–30)
BILIRUBIN TOTAL: 0.3 mg/dL (ref 0.2–1.2)
BUN: 15 mg/dL (ref 7–25)
CALCIUM: 8.9 mg/dL (ref 8.6–10.2)
CO2: 25 mmol/L (ref 20–31)
CREATININE: 0.75 mg/dL (ref 0.50–1.10)
Chloride: 106 mmol/L (ref 98–110)
GFR, Est African American: >89 mL/min (ref >=60)
GFR, Est Non African American: >89 mL/min (ref >=60)
GLUCOSE: 89 mg/dL (ref 65–99)
POTASSIUM: 4.6 mmol/L (ref 3.5–5.3)
Sodium: 136 mmol/L (ref 135–146)
TOTAL PROTEIN: 7.3 g/dL (ref 6.1–8.1)

## 2015-01-11 LAB — RPR

## 2015-01-12 LAB — T-HELPER CELL (CD4) - (RCID CLINIC ONLY)
CD4 T CELL HELPER: 21 % — AB (ref 33–55)
CD4 T Cell Abs: 170 /uL — ABNORMAL LOW (ref 400–2700)

## 2015-01-12 LAB — URINE CYTOLOGY ANCILLARY ONLY
Chlamydia: NEGATIVE
Neisseria Gonorrhea: NEGATIVE

## 2015-01-13 LAB — HIV-1 RNA QUANT-NO REFLEX-BLD

## 2015-01-25 ENCOUNTER — Other Ambulatory Visit: Payer: Self-pay | Admitting: *Deleted

## 2015-01-25 ENCOUNTER — Encounter: Payer: Self-pay | Admitting: Internal Medicine

## 2015-01-25 ENCOUNTER — Ambulatory Visit (INDEPENDENT_AMBULATORY_CARE_PROVIDER_SITE_OTHER): Payer: Self-pay | Admitting: Internal Medicine

## 2015-01-25 VITALS — BP 112/74 | HR 73 | Temp 98.4°F | Wt 173.0 lb

## 2015-01-25 DIAGNOSIS — G63 Polyneuropathy in diseases classified elsewhere: Secondary | ICD-10-CM

## 2015-01-25 DIAGNOSIS — B20 Human immunodeficiency virus [HIV] disease: Secondary | ICD-10-CM

## 2015-01-25 DIAGNOSIS — L259 Unspecified contact dermatitis, unspecified cause: Secondary | ICD-10-CM

## 2015-01-25 DIAGNOSIS — B372 Candidiasis of skin and nail: Secondary | ICD-10-CM

## 2015-01-25 MED ORDER — PREGABALIN 50 MG PO CAPS: 50.0000 mg | ORAL_CAPSULE | Freq: Every day | ORAL | Status: DC

## 2015-01-25 MED ORDER — HYDROXYZINE HCL 10 MG PO TABS: 10.0000 mg | ORAL_TABLET | Freq: Three times a day (TID) | ORAL | Status: DC | PRN

## 2015-01-25 MED ORDER — HYDROCORTISONE 1 % EX OINT: 1.0000 "application " | TOPICAL_OINTMENT | Freq: Two times a day (BID) | CUTANEOUS | Status: DC

## 2015-01-25 MED ORDER — NYSTATIN 100000 UNIT/GM EX POWD: Freq: Two times a day (BID) | CUTANEOUS | Status: DC

## 2015-01-25 NOTE — Progress Notes (Signed)
Patient ID: Carolyn Williamson, female   DOB: October 07, 1970, 44 y.o.   MRN: [id number]      Rfv: sick visit of rash  Patient ID: Carolyn Williamson, female   DOB: [birth year], 44 y.o.   MRN: [id number]  HPI 44yo F with HIV disease, CD 4 count of 170/VL<20 on genvoya. She is doing well with her hiv meds but reports having a pruritic rash around her neck for hte past week. She often changes her laundry detergent.   She also notices a rash underneath her breast for which she uses baby powder  She works on her feet 8-12hr per day and notices lower extremity edema at the end of the day  Outpatient Encounter Prescriptions as of 01/25/2015  Medication Sig  . elvitegravir-cobicistat-emtricitabine-tenofovir (GENVOYA) 150-150-200-10 MG TABS tablet Take 1 tablet by mouth daily with breakfast.  . omeprazole (PRILOSEC) 20 MG capsule Take 1 capsule (20 mg total) by mouth daily. Prendre 2 heures avant stribild  . sulfamethoxazole-trimethoprim (BACTRIM,SEPTRA) 400-80 MG per tablet TAKE 1 TABLET BY MOUTH DAILY  . LYRICA 50 MG capsule TAKE ONE CAPSULE BY MOUTH DAILY (Patient not taking: Reported on 01/25/2015)   No facility-administered encounter medications on file as of 01/25/2015.     Patient Active Problem List   Diagnosis Date Noted  . Bilateral leg edema 02/25/2014  . Abdominal muscle strain 01/29/2014  . Premature menopause 01/06/2014  . Seasonal allergies 01/06/2014  . Low back pain 11/30/2013  . Abdominal pain, epigastric 10/21/2013  . Myopia 10/21/2013  . Dental caries 10/21/2013  . Immigrant with language difficulty 09/14/2013  . Refugee health examination 09/14/2013  . HIV disease ([employer]) 09/14/2013  . Acne vulgaris 09/14/2013     Health Maintenance Due  Topic Date Due  . INFLUENZA VACCINE  10/11/2014     Review of Systems  Physical Exam   BP 112/74 mmHg  Pulse 73  Temp(Src) 98.4 F (36.9 C) (Oral)  Wt 173 lb (78.472 kg) Physical Exam  Constitutional:  oriented to person, place, and  time. appears well-developed and well-nourished. No distress.  HENT: /AT, PERRLA, no scleral icterus Mouth/Throat: Oropharynx is clear and moist. No oropharyngeal exudate.  Cardiovascular: Normal rate, regular rhythm and normal heart sounds. Exam reveals no gallop and no friction rub.  No murmur heard.  Pulmonary/Chest: Effort normal and breath sounds normal. No respiratory distress.  has no wheezes.  Neck = supple, no nuchal rigidity Abdominal: Soft. Bowel sounds are normal.  exhibits no distension. There is no tenderness.  Lymphadenopathy: no cervical adenopathy. No axillary adenopathy Neurological: alert and oriented to person, place, and time.  Skin: Small macules at neck line. Excoriation marks. Hypopigmentation underneath breast Ext= trace pitting edema Psychiatric: a normal mood and affect.  behavior is normal.   Lab Results  Component Value Date   CD4TCELL 21* 01/11/2015   Lab Results  Component Value Date   CD4TABS 170* 01/11/2015   CD4TABS 130* 10/19/2014   CD4TABS 110* 06/10/2014   Lab Results  Component Value Date   HIV1RNAQUANT <20 01/11/2015   Lab Results  Component Value Date   HEPBSAB NEG 10/06/2013   No results found for: RPR  CBC Lab Results  Component Value Date   WBC 2.6* 01/11/2015   RBC 3.94 01/11/2015   HGB 12.0 01/11/2015   HCT 35.7* 01/11/2015   PLT 218 01/11/2015   MCV 90.6 01/11/2015   MCH 30.5 01/11/2015   MCHC 33.6 01/11/2015   RDW 13.5 01/11/2015   LYMPHSABS 0.9 01/11/2015  MONOABS 0.3 01/11/2015   EOSABS 0.5 01/11/2015   BASOSABS 0.0 01/11/2015   BMET Lab Results  Component Value Date   NA 136 01/11/2015   K 4.6 01/11/2015   CL 106 01/11/2015   CO2 25 01/11/2015   GLUCOSE 89 01/11/2015   BUN 15 01/11/2015   CREATININE 0.75 01/11/2015   CALCIUM 8.9 01/11/2015   GFRNONAA >89 01/11/2015   GFRAA >89 01/11/2015     Assessment and Plan  hiv disease =well controlled on genvoya  oi proph = needs to continue on bactrim  since cd 4 count <200  neuropahty = will refill her lyrica 75mg  bid  Contact dermatitis? = possibly from detergent. We will give her hydrocortisone cream 1% to apply bid plus have her do atarax to prevent excoriation. Will have her try to do different laundry detergent  Candidal skin infection  Of chest= will anti-fungal powder  Lower extremity edema = will do a trial of compression stockings  Spent 30 min in direct face to face time in translation of treatment recommendations

## 2015-03-01 ENCOUNTER — Other Ambulatory Visit: Payer: Self-pay | Admitting: Internal Medicine

## 2015-03-01 DIAGNOSIS — B20 Human immunodeficiency virus [HIV] disease: Secondary | ICD-10-CM

## 2015-04-01 ENCOUNTER — Other Ambulatory Visit: Payer: Self-pay | Admitting: Internal Medicine

## 2015-04-26 ENCOUNTER — Other Ambulatory Visit (INDEPENDENT_AMBULATORY_CARE_PROVIDER_SITE_OTHER): Payer: Self-pay

## 2015-04-26 DIAGNOSIS — Z79899 Other long term (current) drug therapy: Secondary | ICD-10-CM

## 2015-04-26 DIAGNOSIS — B20 Human immunodeficiency virus [HIV] disease: Secondary | ICD-10-CM

## 2015-04-26 LAB — COMPREHENSIVE METABOLIC PANEL
ALBUMIN: 3.6 g/dL (ref 3.6–5.1)
ALK PHOS: 92 U/L (ref 33–115)
ALT: 10 U/L (ref 6–29)
AST: 18 U/L (ref 10–30)
BILIRUBIN TOTAL: 0.3 mg/dL (ref 0.2–1.2)
BUN: 12 mg/dL (ref 7–25)
CHLORIDE: 107 mmol/L (ref 98–110)
CO2: 24 mmol/L (ref 20–31)
CREATININE: 0.66 mg/dL (ref 0.50–1.10)
Calcium: 9 mg/dL (ref 8.6–10.2)
Glucose, Bld: 92 mg/dL (ref 65–99)
Potassium: 4.3 mmol/L (ref 3.5–5.3)
SODIUM: 137 mmol/L (ref 135–146)
TOTAL PROTEIN: 7.6 g/dL (ref 6.1–8.1)

## 2015-04-26 LAB — CBC WITH DIFFERENTIAL/PLATELET
BASOS ABS: 0 10*3/uL (ref 0.0–0.1)
BASOS PCT: 0 % (ref 0–1)
Eosinophils Absolute: 0.5 10*3/uL (ref 0.0–0.7)
Eosinophils Relative: 18 % — ABNORMAL HIGH (ref 0–5)
HCT: 36.2 % (ref 36.0–46.0)
HEMOGLOBIN: 12.4 g/dL (ref 12.0–15.0)
LYMPHS ABS: 0.8 10*3/uL (ref 0.7–4.0)
Lymphocytes Relative: 32 % (ref 12–46)
MCH: 31 pg (ref 26.0–34.0)
MCHC: 34.3 g/dL (ref 30.0–36.0)
MCV: 90.5 fL (ref 78.0–100.0)
MONOS PCT: 9 % (ref 3–12)
MPV: 9.8 fL (ref 8.6–12.4)
Monocytes Absolute: 0.2 10*3/uL (ref 0.1–1.0)
NEUTROS ABS: 1.1 10*3/uL — AB (ref 1.7–7.7)
NEUTROS PCT: 41 % — AB (ref 43–77)
Platelets: 235 10*3/uL (ref 150–400)
RBC: 4 MIL/uL (ref 3.87–5.11)
RDW: 14.4 % (ref 11.5–15.5)
WBC: 2.6 10*3/uL — ABNORMAL LOW (ref 4.0–10.5)

## 2015-04-26 LAB — LIPID PANEL
Cholesterol: 167 mg/dL (ref 125–200)
HDL: 46 mg/dL (ref >=46)
LDL CALC: 103 mg/dL (ref <130)
TRIGLYCERIDES: 91 mg/dL (ref <150)
Total CHOL/HDL Ratio: 3.6 Ratio (ref <=5.0)
VLDL: 18 mg/dL (ref <30)

## 2015-04-27 LAB — HIV-1 RNA QUANT-NO REFLEX-BLD: HIV 1 RNA Quant: <20 copies/mL (ref <20)

## 2015-04-27 LAB — T-HELPER CELL (CD4) - (RCID CLINIC ONLY)
CD4 % Helper T Cell: 20 % — ABNORMAL LOW (ref 33–55)
CD4 T CELL ABS: 180 /uL — AB (ref 400–2700)

## 2015-05-10 ENCOUNTER — Encounter: Payer: Self-pay | Admitting: Internal Medicine

## 2015-05-10 ENCOUNTER — Ambulatory Visit (INDEPENDENT_AMBULATORY_CARE_PROVIDER_SITE_OTHER): Payer: Self-pay | Admitting: Internal Medicine

## 2015-05-10 VITALS — BP 108/72 | HR 67 | Temp 97.7°F | Wt 174.0 lb

## 2015-05-10 DIAGNOSIS — M1990 Unspecified osteoarthritis, unspecified site: Secondary | ICD-10-CM

## 2015-05-10 DIAGNOSIS — B20 Human immunodeficiency virus [HIV] disease: Secondary | ICD-10-CM

## 2015-05-10 MED ORDER — NAPROXEN 500 MG PO TABS: 500.0000 mg | ORAL_TABLET | Freq: Two times a day (BID) | ORAL | Status: DC

## 2015-05-10 NOTE — Progress Notes (Signed)
Patient ID: Carolyn Williamson, female   DOB: [birth year], 45 y.o.   MRN: [id number]       Patient ID: Carolyn Williamson, female   DOB: Aug 10, 1970, 45 y.o.   MRN: [id number]  HPI 45yo F with CD 4 count 180/VL<20, currently on genvoya, on bactrim for oi proph. She reports having left wrist pain. She notices that it worsens with repeated motion. 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 No trauma to her wrist. She has not used any topical treatment (ice or heat) or any nsaids or brace  Outpatient Encounter Prescriptions as of 05/10/2015  Medication Sig  . elvitegravir-cobicistat-emtricitabine-tenofovir (GENVOYA) 150-150-200-10 MG TABS tablet Take 1 tablet by mouth daily with breakfast.  . hydrOXYzine (ATARAX/VISTARIL) 10 MG tablet Take 1 tablet (10 mg total) by mouth 3 (three) times daily as needed. si necessaire pour gratter  . nystatin (MYCOSTATIN) powder Apply topically 2 (two) times daily. Sous votre poitrine  . omeprazole (PRILOSEC) 20 MG capsule Take 1 capsule (20 mg total) by mouth daily. Prendre 2 heures avant stribild  . pregabalin (LYRICA) 50 MG capsule Take 1 capsule (50 mg total) by mouth daily. Pour la douleur du nerf  . sulfamethoxazole-trimethoprim (BACTRIM,SEPTRA) 400-80 MG tablet TAKE 1 TABLET BY MOUTH DAILY  . hydrocortisone 1 % ointment Apply 1 application topically 2 (two) times daily. Sure votre cou (Patient not taking: Reported on 05/10/2015)   No facility-administered encounter medications on file as of 05/10/2015.     Patient Active Problem List   Diagnosis Date Noted  . Bilateral leg edema 02/25/2014  . Abdominal muscle strain 01/29/2014  . Premature menopause 01/06/2014  . Seasonal allergies 01/06/2014  . Low back pain 11/30/2013  . Abdominal pain, epigastric 10/21/2013  . Myopia 10/21/2013  . Dental caries 10/21/2013  . Immigrant with language difficulty 09/14/2013  . Refugee health examination 09/14/2013  . HIV  disease ([place]) 09/14/2013  . Acne vulgaris 09/14/2013     There are no preventive care reminders to display for this patient.   Review of Systems + left wrist pain, 10 point ros is negative Physical Exam   BP 108/72 mmHg  Pulse 67  Temp(Src) 97.7 F (36.5 C) (Oral)  Wt 174 lb (78.926 kg) Physical Exam  Constitutional:  oriented to person, place, and time. appears well-developed and well-nourished. No distress.  HENT: [place]/AT, PERRLA, no scleral icterus Mouth/Throat: Oropharynx is clear and moist. No oropharyngeal exudate.  Cardiovascular: Normal rate, regular rhythm and normal heart sounds. Exam reveals no gallop and no friction rub.  No murmur heard.  Pulmonary/Chest: Effort normal and breath sounds normal. No respiratory distress.  has no wheezes.  Neck = supple, no nuchal rigidity Abdominal: Soft. Bowel sounds are normal.  exhibits no distension. There is no tenderness.  Lymphadenopathy: no cervical adenopathy. No axillary adenopathy Neurological: alert and oriented to person, place, and time.  Ext: left wrist has full range of motion some point tenderness to radial aspect of wrist, worsens when she adducts her thumb into palm of hand Skin: Skin is warm and dry. No rash noted. No erythema.  Psychiatric: a normal mood and affect.  behavior is normal.   Lab Results  Component Value Date   CD4TCELL 20* 04/26/2015   Lab Results  Component Value Date   CD4TABS 180* 04/26/2015   CD4TABS 170* 01/11/2015   CD4TABS 130* 10/19/2014   Lab Results  Component Value Date   HIV1RNAQUANT <20 04/26/2015   Lab  Results  Component Value Date   HEPBSAB NEG 10/06/2013   No results found for: RPR  CBC Lab Results  Component Value Date   WBC 2.6* 04/26/2015   RBC 4.00 04/26/2015   HGB 12.4 04/26/2015   HCT 36.2 04/26/2015   PLT 235 04/26/2015   MCV 90.5 04/26/2015   MCH 31.0 04/26/2015   MCHC 34.3 04/26/2015   RDW 14.4 04/26/2015   LYMPHSABS 0.8 04/26/2015   MONOABS 0.2  04/26/2015   EOSABS 0.5 04/26/2015   BASOSABS 0.0 04/26/2015   BMET Lab Results  Component Value Date   NA 137 04/26/2015   K 4.3 04/26/2015   CL 107 04/26/2015   CO2 24 04/26/2015   GLUCOSE 92 04/26/2015   BUN 12 04/26/2015   CREATININE 0.66 04/26/2015   CALCIUM 9.0 04/26/2015   GFRNONAA >89 01/11/2015   GFRAA >89 01/11/2015     Assessment and Plan  Wrist pain = 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 Will do a trial of naproxyn 500mg  bid on full stomach x 14 d. Will do cold pack and also have her try wrist brace at work  hiv disease = well controlled. Continue on genvoya  oi proph = continue on bactrim

## 2015-05-31 ENCOUNTER — Other Ambulatory Visit: Payer: Self-pay | Admitting: Internal Medicine

## 2015-06-29 ENCOUNTER — Other Ambulatory Visit: Payer: Self-pay | Admitting: Internal Medicine

## 2015-06-30 ENCOUNTER — Other Ambulatory Visit: Payer: Self-pay | Admitting: Internal Medicine

## 2015-06-30 DIAGNOSIS — B20 Human immunodeficiency virus [HIV] disease: Secondary | ICD-10-CM

## 2015-08-16 ENCOUNTER — Encounter: Payer: Self-pay | Admitting: Internal Medicine

## 2015-08-16 ENCOUNTER — Ambulatory Visit (INDEPENDENT_AMBULATORY_CARE_PROVIDER_SITE_OTHER): Payer: Self-pay | Admitting: Internal Medicine

## 2015-08-16 VITALS — BP 118/79 | HR 64 | Temp 98.1°F | Wt 175.0 lb

## 2015-08-16 DIAGNOSIS — B20 Human immunodeficiency virus [HIV] disease: Secondary | ICD-10-CM

## 2015-08-16 DIAGNOSIS — J302 Other seasonal allergic rhinitis: Secondary | ICD-10-CM

## 2015-08-16 DIAGNOSIS — Z124 Encounter for screening for malignant neoplasm of cervix: Secondary | ICD-10-CM

## 2015-08-16 LAB — CBC WITH DIFFERENTIAL/PLATELET
BASOS ABS: 21 {cells}/uL (ref 0–200)
Basophils Relative: 1 %
EOS ABS: 483 {cells}/uL (ref 15–500)
Eosinophils Relative: 23 %
HEMATOCRIT: 37.6 % (ref 35.0–45.0)
HEMOGLOBIN: 12.6 g/dL (ref 11.7–15.5)
LYMPHS ABS: 798 {cells}/uL — AB (ref 850–3900)
Lymphocytes Relative: 38 %
MCH: 30.8 pg (ref 27.0–33.0)
MCHC: 33.5 g/dL (ref 32.0–36.0)
MCV: 91.9 fL (ref 80.0–100.0)
MONO ABS: 273 {cells}/uL (ref 200–950)
MPV: 9.7 fL (ref 7.5–12.5)
Monocytes Relative: 13 %
NEUTROS PCT: 25 %
Neutro Abs: 525 cells/uL — ABNORMAL LOW (ref 1500–7800)
Platelets: 245 10*3/uL (ref 140–400)
RBC: 4.09 MIL/uL (ref 3.80–5.10)
RDW: 14.6 % (ref 11.0–15.0)
WBC: 2.1 10*3/uL — ABNORMAL LOW (ref 3.8–10.8)

## 2015-08-16 LAB — COMPLETE METABOLIC PANEL WITH GFR
ALT: 11 U/L (ref 6–29)
AST: 27 U/L (ref 10–35)
Albumin: 3.9 g/dL (ref 3.6–5.1)
Alkaline Phosphatase: 87 U/L (ref 33–115)
BUN: 8 mg/dL (ref 7–25)
CHLORIDE: 105 mmol/L (ref 98–110)
CO2: 19 mmol/L — AB (ref 20–31)
Calcium: 8.7 mg/dL (ref 8.6–10.2)
Creat: 0.68 mg/dL (ref 0.50–1.10)
Glucose, Bld: 92 mg/dL (ref 65–99)
POTASSIUM: 4.2 mmol/L (ref 3.5–5.3)
Sodium: 136 mmol/L (ref 135–146)
Total Bilirubin: 0.4 mg/dL (ref 0.2–1.2)
Total Protein: 7.6 g/dL (ref 6.1–8.1)

## 2015-08-16 MED ORDER — KETOTIFEN FUMARATE 0.025 % OP SOLN: 1.0000 [drp] | Freq: Two times a day (BID) | OPHTHALMIC | Status: AC

## 2015-08-16 NOTE — Progress Notes (Signed)
Subjective:    Patient ID: Carolyn Williamson, female    DOB: November 06, 1970, 45 y.o.   MRN: [id number]  HPI Carolyn Williamson is 45yo F french speaking from [place], comes with interpretor. Her HIV disease is well controlled, currently CD 4 count of 180/VL<20 on genvoya. She continues to take bactrim for oi prophylaxis.  She also mentions that she has lower extremity joint pain, mostly knees, and at end of the day she notices swelling in her legs bilaterally. She works doing 8 hr shifts manual labor. She notices her symptoms improve overnight.  Current Outpatient Prescriptions on File Prior to Visit  Medication Sig Dispense Refill  . GENVOYA 150-150-200-10 MG TABS tablet TAKE 1 TABLET BY MOUTH DAILY WITH BREAKFAST 30 tablet 5  . pregabalin (LYRICA) 50 MG capsule Take 1 capsule (50 mg total) by mouth daily. Pour la douleur du nerf 30 capsule 5  . sulfamethoxazole-trimethoprim (BACTRIM,SEPTRA) 400-80 MG tablet TAKE 1 TABLET BY MOUTH DAILY 30 tablet 5  . hydrocortisone 1 % ointment Apply 1 application topically 2 (two) times daily. Sure votre cou (Patient not taking: Reported on 05/10/2015) 30 g 0  . hydrOXYzine (ATARAX/VISTARIL) 10 MG tablet Take 1 tablet (10 mg total) by mouth 3 (three) times daily as needed. si necessaire pour gratter (Patient not taking: Reported on 08/16/2015) 30 tablet 0  . naproxen (NAPROSYN) 500 MG tablet Take 1 tablet (500 mg total) by mouth 2 (two) times daily with a meal. (Patient not taking: Reported on 08/16/2015) 30 tablet 0  . nystatin (MYCOSTATIN) powder Apply topically 2 (two) times daily. Sous votre poitrine (Patient not taking: Reported on 08/16/2015) 15 g 0  . omeprazole (PRILOSEC) 20 MG capsule TAKE ONE CAPSULE BY MOUTH DAILY (Patient not taking: Reported on 08/16/2015) 30 capsule 5   No current facility-administered medications on file prior to visit.   Active Ambulatory Problems    Diagnosis Date Noted  . Immigrant with language difficulty 09/14/2013  . Refugee health  examination 09/14/2013  . HIV disease ([place]) 09/14/2013  . Acne vulgaris 09/14/2013  . Abdominal pain, epigastric 10/21/2013  . Myopia 10/21/2013  . Dental caries 10/21/2013  . Low back pain 11/30/2013  . Premature menopause 01/06/2014  . Seasonal allergies 01/06/2014  . Abdominal muscle strain 01/29/2014  . Bilateral leg edema 02/25/2014   Resolved Ambulatory Problems    Diagnosis Date Noted  . No Resolved Ambulatory Problems   Past Medical History  Diagnosis Date  . HIV (human immunodeficiency virus infection) ([place])   . Fibroids       Review of Systems Arthralgias, itchy eyes, nasal congestion otherwise 10 point ros is negative    Objective:   Physical Exam BP 118/79 mmHg  Pulse 64  Temp(Src) 98.1 F (36.7 C) (Oral)  Wt 175 lb (79.379 kg) Physical Exam  Constitutional:  oriented to person, place, and time. appears well-developed and well-nourished. No distress.  HENT: [place]/AT, PERRLA, no scleral icterus, injected conjunctiva Mouth/Throat: Oropharynx is clear and moist. No oropharyngeal exudate.  Cardiovascular: Normal rate, regular rhythm and normal heart sounds. Exam reveals no gallop and no friction rub.  No murmur heard.  Pulmonary/Chest: Effort normal and breath sounds normal. No respiratory distress.  has no wheezes.  Neck = supple, no nuchal rigidity Abdominal: Soft. Bowel sounds are normal.  exhibits no distension. There is no tenderness.  Lymphadenopathy: no cervical adenopathy. No axillary adenopathy Neurological: alert and oriented to person, place, and time.  Skin: Skin is warm and dry. No rash noted. No  erythema.  Psychiatric: a normal mood and affect.  behavior is normal.    Labs: Lab Results  Component Value Date   CD4TCELL 20* 04/26/2015   CD4TABS 180* 04/26/2015   Lab Results  Component Value Date   HIV1RNAQUANT <20 04/26/2015       Assessment & Plan:  hiv disease = will check labs, give refill  gerd = resolved, will d/c  omeprazole  Seasonal allergies = will give eye drops  Arthralgias with dependent edema = likely from long hours being on her feet at work. Recommended compression stockings. Can use ibuprofen if needed for knee pain.  Rash = appears resolved. Asked her to come back in if she has difficulties

## 2015-08-17 LAB — HIV-1 RNA QUANT-NO REFLEX-BLD

## 2015-08-17 LAB — T-HELPER CELL (CD4) - (RCID CLINIC ONLY)
CD4 T CELL ABS: 190 /uL — AB (ref 400–2700)
CD4 T CELL HELPER: 21 % — AB (ref 33–55)

## 2015-08-17 LAB — CYTOLOGY - PAP

## 2015-08-22 ENCOUNTER — Encounter: Payer: Self-pay | Admitting: *Deleted

## 2015-09-01 ENCOUNTER — Other Ambulatory Visit: Payer: Self-pay | Admitting: Internal Medicine

## 2015-09-01 DIAGNOSIS — B20 Human immunodeficiency virus [HIV] disease: Secondary | ICD-10-CM

## 2015-09-01 DIAGNOSIS — G63 Polyneuropathy in diseases classified elsewhere: Principal | ICD-10-CM

## 2015-10-17 ENCOUNTER — Encounter: Payer: Self-pay | Admitting: Internal Medicine

## 2015-10-17 ENCOUNTER — Ambulatory Visit: Payer: Self-pay

## 2015-11-17 ENCOUNTER — Ambulatory Visit: Payer: Self-pay | Admitting: Internal Medicine

## 2015-11-21 ENCOUNTER — Ambulatory Visit: Payer: Self-pay | Admitting: Internal Medicine

## 2015-11-22 ENCOUNTER — Ambulatory Visit: Payer: Self-pay | Admitting: Internal Medicine

## 2015-11-24 ENCOUNTER — Other Ambulatory Visit: Payer: Self-pay | Admitting: Internal Medicine

## 2015-12-20 ENCOUNTER — Other Ambulatory Visit: Payer: Self-pay | Admitting: Internal Medicine

## 2015-12-20 DIAGNOSIS — B20 Human immunodeficiency virus [HIV] disease: Secondary | ICD-10-CM

## 2015-12-22 ENCOUNTER — Ambulatory Visit (INDEPENDENT_AMBULATORY_CARE_PROVIDER_SITE_OTHER): Payer: Self-pay | Admitting: Internal Medicine

## 2015-12-22 VITALS — BP 124/82 | HR 73 | Temp 97.4°F | Wt 167.0 lb

## 2015-12-22 DIAGNOSIS — L232 Allergic contact dermatitis due to cosmetics: Secondary | ICD-10-CM

## 2015-12-22 DIAGNOSIS — Z23 Encounter for immunization: Secondary | ICD-10-CM

## 2015-12-22 DIAGNOSIS — B20 Human immunodeficiency virus [HIV] disease: Secondary | ICD-10-CM

## 2015-12-22 DIAGNOSIS — M722 Plantar fascial fibromatosis: Secondary | ICD-10-CM

## 2015-12-22 NOTE — Progress Notes (Signed)
RFV: hiv follow up Patient ID: Carolyn Williamson, female   DOB: 10-25-1970, 45 y.o.   MRN: [id number]  HPI Carolyn Williamson is a 45yo F from [place], french speaking who has advanced hiv disease, her Cd 4 count fo 190/VL<20 on genvoya and bactrim SS oi proph. She states that she works often 10-12hr shifts at [employer] and gamble on her feet the majority of the time. She takes her medicines regularly and does not miss her medications. She does report that she does have feet pain, lower extremity swelling to her legs at end of her shift. She also does a lot of repetitive motion with her hands for which she gets occasional wrist pains  Outpatient Encounter Prescriptions as of 12/22/2015  Medication Sig  . GENVOYA 150-150-200-10 MG TABS tablet TAKE 1 TABLET BY MOUTH DAILY WITH BREAKFAST  . ketotifen (ALLERGY EYE DROPS) 0.025 % ophthalmic solution Place 1 drop into both eyes 2 (two) times daily.  Marland Kitchen LYRICA 50 MG capsule TAKE ONE CAPSULE BY MOUTH DAILY  . omeprazole (PRILOSEC) 20 MG capsule TAKE ONE CAPSULE BY MOUTH DAILY  . sulfamethoxazole-trimethoprim (BACTRIM,SEPTRA) 400-80 MG tablet TAKE 1 TABLET BY MOUTH DAILY   No facility-administered encounter medications on file as of 12/22/2015.      Patient Active Problem List   Diagnosis Date Noted  . Bilateral leg edema 02/25/2014  . Abdominal muscle strain 01/29/2014  . Premature menopause 01/06/2014  . Seasonal allergies 01/06/2014  . Low back pain 11/30/2013  . Abdominal pain, epigastric 10/21/2013  . Myopia 10/21/2013  . Dental caries 10/21/2013  . Immigrant with language difficulty 09/14/2013  . Refugee health examination 09/14/2013  . HIV disease ([place]) 09/14/2013  . Acne vulgaris 09/14/2013     Health Maintenance Due  Topic Date Due  . INFLUENZA VACCINE  10/11/2015     Review of Systems Per hpi,  Reports that she gets hallitosis. otherwise 10 point ros is negative Physical Exam   BP 124/82   Pulse 73   Temp 97.4 F (36.3  C) (Oral)   Wt 75.8 kg (167 lb)   BMI 27.79 kg/m  Physical Exam  Constitutional:  oriented to person, place, and time. appears well-developed and well-nourished. No distress.  HENT: [place]/AT, PERRLA, no scleral icterus Mouth/Throat: Oropharynx is clear and moist. No oropharyngeal exudate.  Cardiovascular: Normal rate, regular rhythm and normal heart sounds. Exam reveals no gallop and no friction rub.  No murmur heard.  Pulmonary/Chest: Effort normal and breath sounds normal. No respiratory distress.  has no wheezes.  Neck = supple, no nuchal rigidity Abdominal: Soft. Bowel sounds are normal.  exhibits no distension. There is no tenderness.  Lymphadenopathy: no cervical adenopathy. No axillary adenopathy Neurological: alert and oriented to person, place, and time.  Skin: Skin is warm and dry. No rash noted. No erythema. Excoriation to base of neck near tshirt line Psychiatric: a normal mood and affect.  behavior is normal.   Lab Results  Component Value Date   CD4TCELL 21 (L) 08/16/2015   Lab Results  Component Value Date   CD4TABS 190 (L) 08/16/2015   CD4TABS 180 (L) 04/26/2015   CD4TABS 170 (L) 01/11/2015   Lab Results  Component Value Date   HIV1RNAQUANT <20 08/16/2015   Lab Results  Component Value Date   HEPBSAB NEG 10/06/2013   No results found for: RPR  CBC Lab Results  Component Value Date   WBC 2.1 (L) 08/16/2015   RBC 4.09 08/16/2015   HGB 12.6 08/16/2015  HCT 37.6 08/16/2015   PLT 245 08/16/2015   MCV 91.9 08/16/2015   MCH 30.8 08/16/2015   MCHC 33.5 08/16/2015   RDW 14.6 08/16/2015   LYMPHSABS 798 (L) 08/16/2015   MONOABS 273 08/16/2015   EOSABS 483 08/16/2015   BASOSABS 21 08/16/2015   BMET Lab Results  Component Value Date   NA 136 08/16/2015   K 4.2 08/16/2015   CL 105 08/16/2015   CO2 19 (L) 08/16/2015   GLUCOSE 92 08/16/2015   BUN 8 08/16/2015   CREATININE 0.68 08/16/2015   CALCIUM 8.7 08/16/2015   GFRNONAA >89 08/16/2015   GFRAA >89  08/16/2015     Assessment and Plan   hiv disease = will plan to continue on current regimen  And check labs  Excoriation/contact dermatitis = plan to do scent free soap and have her try hydrocortisone  LE edema = likely from being on job 12 hr per day will do compression  Plantar fasciitis = will do a trial of shoe inserts

## 2015-12-22 NOTE — Patient Instructions (Addendum)
[facility]:   Hydrocortisone cream 1%,  [place] cette crme sur votre cou deux fois par jour  Dr. Felicie Morn foot insert, Placez-les dans vos chaussures  Compression socks, utiliser des chaussettes de compression pour [employer] gonflement des [place]

## 2015-12-23 LAB — T-HELPER CELL (CD4) - (RCID CLINIC ONLY)
CD4 T CELL ABS: 170 /uL — AB (ref 400–2700)
CD4 T CELL HELPER: 19 % — AB (ref 33–55)

## 2015-12-26 LAB — HIV-1 RNA QUANT-NO REFLEX-BLD
HIV 1 RNA Quant: <20 copies/mL (ref <20)
HIV-1 RNA Quant, Log: <1.3 Log copies/mL (ref <1.30)

## 2016-02-08 ENCOUNTER — Other Ambulatory Visit: Payer: Self-pay | Admitting: Internal Medicine

## 2016-02-08 DIAGNOSIS — B20 Human immunodeficiency virus [HIV] disease: Secondary | ICD-10-CM

## 2016-02-08 DIAGNOSIS — G63 Polyneuropathy in diseases classified elsewhere: Principal | ICD-10-CM

## 2016-02-08 NOTE — Telephone Encounter (Signed)
[place]rica phone to pharmacy.    Laverle Patter, RN

## 2016-03-08 ENCOUNTER — Other Ambulatory Visit: Payer: Self-pay | Admitting: Internal Medicine

## 2016-03-08 DIAGNOSIS — K219 Gastro-esophageal reflux disease without esophagitis: Secondary | ICD-10-CM

## 2016-03-27 ENCOUNTER — Encounter: Payer: Self-pay | Admitting: Internal Medicine

## 2016-03-27 ENCOUNTER — Ambulatory Visit (INDEPENDENT_AMBULATORY_CARE_PROVIDER_SITE_OTHER): Payer: Self-pay | Admitting: Internal Medicine

## 2016-03-27 VITALS — BP 102/74 | HR 66 | Temp 98.0°F | Ht 67.0 in | Wt 172.4 lb

## 2016-03-27 DIAGNOSIS — R21 Rash and other nonspecific skin eruption: Secondary | ICD-10-CM

## 2016-03-27 DIAGNOSIS — Z23 Encounter for immunization: Secondary | ICD-10-CM

## 2016-03-27 DIAGNOSIS — Z79899 Other long term (current) drug therapy: Secondary | ICD-10-CM

## 2016-03-27 DIAGNOSIS — B20 Human immunodeficiency virus [HIV] disease: Secondary | ICD-10-CM

## 2016-03-27 LAB — CBC WITH DIFFERENTIAL/PLATELET
BASOS PCT: 1 %
Basophils Absolute: 22 cells/uL (ref 0–200)
EOS ABS: 242 {cells}/uL (ref 15–500)
Eosinophils Relative: 11 %
HEMATOCRIT: 38.4 % (ref 35.0–45.0)
HEMOGLOBIN: 13 g/dL (ref 11.7–15.5)
LYMPHS ABS: 748 {cells}/uL — AB (ref 850–3900)
LYMPHS PCT: 34 %
MCH: 31.4 pg (ref 27.0–33.0)
MCHC: 33.9 g/dL (ref 32.0–36.0)
MCV: 92.8 fL (ref 80.0–100.0)
MPV: 9.9 fL (ref 7.5–12.5)
Monocytes Absolute: 264 cells/uL (ref 200–950)
Monocytes Relative: 12 %
NEUTROS PCT: 42 %
Neutro Abs: 924 cells/uL — ABNORMAL LOW (ref 1500–7800)
Platelets: 232 10*3/uL (ref 140–400)
RBC: 4.14 MIL/uL (ref 3.80–5.10)
RDW: 14.5 % (ref 11.0–15.0)
WBC: 2.2 10*3/uL — AB (ref 3.8–10.8)

## 2016-03-27 LAB — COMPLETE METABOLIC PANEL WITH GFR
ALBUMIN: 3.9 g/dL (ref 3.6–5.1)
ALK PHOS: 87 U/L (ref 33–115)
ALT: 9 U/L (ref 6–29)
AST: 18 U/L (ref 10–35)
BILIRUBIN TOTAL: 0.3 mg/dL (ref 0.2–1.2)
BUN: 11 mg/dL (ref 7–25)
CALCIUM: 9.1 mg/dL (ref 8.6–10.2)
CO2: 28 mmol/L (ref 20–31)
CREATININE: 0.66 mg/dL (ref 0.50–1.10)
Chloride: 105 mmol/L (ref 98–110)
GFR, Est Non African American: >89 mL/min (ref >=60)
Glucose, Bld: 98 mg/dL (ref 65–99)
Potassium: 4.2 mmol/L (ref 3.5–5.3)
Sodium: 138 mmol/L (ref 135–146)
Total Protein: 7.8 g/dL (ref 6.1–8.1)

## 2016-03-27 LAB — LIPID PANEL
Cholesterol: 165 mg/dL (ref <200)
HDL: 58 mg/dL (ref >50)
LDL Cholesterol: 92 mg/dL (ref <100)
Total CHOL/HDL Ratio: 2.8 Ratio (ref <5.0)
Triglycerides: 73 mg/dL (ref <150)
VLDL: 15 mg/dL (ref <30)

## 2016-03-27 NOTE — Progress Notes (Signed)
RFV: hiv disease follow up  Patient ID: Carolyn Williamson, female   DOB: 1970-07-01, 46 y.o.   MRN: [id number]  HPI 46yo F with well controlled advanced hiv disease/AIDS, CD 4 count of 170/VL<20 on genvoya plus bactrim proph.she is doing well overall. She states her skin has cleared up, where she thinks it was due to the sun and humidity this past summer/Fall. She does have residual excoriation marks on her neck.  Outpatient Encounter Prescriptions as of 03/27/2016  Medication Sig  . GENVOYA 150-150-200-10 MG TABS tablet TAKE 1 TABLET BY MOUTH DAILY WITH BREAKFAST  . ketotifen (ALLERGY EYE DROPS) 0.025 % ophthalmic solution Place 1 drop into both eyes 2 (two) times daily.  Marland Kitchen LYRICA 50 MG capsule TAKE ONE CAPSULE BY MOUTH EVERY DAY  . omeprazole (PRILOSEC) 20 MG capsule TAKE ONE CAPSULE BY MOUTH DAILY  . sulfamethoxazole-trimethoprim (BACTRIM,SEPTRA) 400-80 MG tablet TAKE 1 TABLET BY MOUTH DAILY   No facility-administered encounter medications on file as of 03/27/2016.      Patient Active Problem List   Diagnosis Date Noted  . Bilateral leg edema 02/25/2014  . Abdominal muscle strain 01/29/2014  . Premature menopause 01/06/2014  . Seasonal allergies 01/06/2014  . Low back pain 11/30/2013  . Abdominal pain, epigastric 10/21/2013  . Myopia 10/21/2013  . Dental caries 10/21/2013  . Immigrant with language difficulty 09/14/2013  . Refugee health examination 09/14/2013  . HIV disease ([place]) 09/14/2013  . Acne vulgaris 09/14/2013     There are no preventive care reminders to display for this patient.   Review of Systems 10 point ros is negative other than what is mentioned in hpi Physical Exam   BP 102/74   Pulse 66   Temp 98 F (36.7 C) (Oral)   Ht 5\' 7"  (1.702 m)   Wt 172 lb 6.4 oz (78.2 kg)   BMI 27.00 kg/m   Physical Exam  Constitutional:  oriented to person, place, and time. appears well-developed and well-nourished. No distress.  HENT: [place]/AT, PERRLA, no scleral  icterus Mouth/Throat: Oropharynx is clear and moist. No oropharyngeal exudate.  Cardiovascular: Normal rate, regular rhythm and normal heart sounds. Exam reveals no gallop and no friction rub.  No murmur heard.  Pulmonary/Chest: Effort normal and breath sounds normal. No respiratory distress.  has no wheezes.  Neck = supple, no nuchal rigidity Abdominal: Soft. Bowel sounds are normal.  exhibits no distension. There is no tenderness.  Lymphadenopathy: no cervical adenopathy. No axillary adenopathy Neurological: alert and oriented to person, place, and time.  Skin: Skin is warm and dry. No rash noted. No erythema.  Psychiatric: a normal mood and affect.  behavior is normal.   Lab Results  Component Value Date   CD4TCELL 19 (L) 12/22/2015   Lab Results  Component Value Date   CD4TABS 170 (L) 12/22/2015   CD4TABS 190 (L) 08/16/2015   CD4TABS 180 (L) 04/26/2015   Lab Results  Component Value Date   HIV1RNAQUANT <20 12/22/2015   Lab Results  Component Value Date   HEPBSAB NEG 10/06/2013   Lab Results  Component Value Date   LABRPR NON REAC 01/11/2015    CBC Lab Results  Component Value Date   WBC 2.1 (L) 08/16/2015   RBC 4.09 08/16/2015   HGB 12.6 08/16/2015   HCT 37.6 08/16/2015   PLT 245 08/16/2015   MCV 91.9 08/16/2015   MCH 30.8 08/16/2015   MCHC 33.5 08/16/2015   RDW 14.6 08/16/2015   LYMPHSABS 798 (L) 08/16/2015  MONOABS 273 08/16/2015   EOSABS 483 08/16/2015    BMET Lab Results  Component Value Date   NA 136 08/16/2015   K 4.2 08/16/2015   CL 105 08/16/2015   CO2 19 (L) 08/16/2015   GLUCOSE 92 08/16/2015   BUN 8 08/16/2015   CREATININE 0.68 08/16/2015   CALCIUM 8.7 08/16/2015   GFRNONAA >89 08/16/2015   GFRAA >89 08/16/2015      Assessment and Plan  Skin lesions to chest = they are not raised. Flat. Non pruritic, previously had area of pruritic papules and wonder if these are scars  But also in differential would be [place]. We will see her back in  4-6 wk to see if any changes in skin lesions to warrant biopsy. No other concerning lesions on torso and arms on at base of neck  hiv disease = well controlled, will check cd 4 count and other labs. Continue with genvoya  oi proph = continue with bactrim  Health maintenance = will give meningococcal vaccine

## 2016-03-28 LAB — T-HELPER CELL (CD4) - (RCID CLINIC ONLY)
CD4 % Helper T Cell: 20 % — ABNORMAL LOW (ref 33–55)
CD4 T CELL ABS: 160 /uL — AB (ref 400–2700)

## 2016-03-30 LAB — HIV-1 RNA QUANT-NO REFLEX-BLD
HIV 1 RNA QUANT: NOT DETECTED {copies}/mL (ref <20)
HIV-1 RNA QUANT, LOG: NOT DETECTED {Log_copies}/mL (ref <1.30)

## 2016-04-03 ENCOUNTER — Other Ambulatory Visit: Payer: Self-pay | Admitting: Internal Medicine

## 2016-04-09 ENCOUNTER — Encounter: Payer: Self-pay | Admitting: Internal Medicine

## 2016-05-02 ENCOUNTER — Other Ambulatory Visit: Payer: Self-pay | Admitting: Internal Medicine

## 2016-05-02 DIAGNOSIS — B20 Human immunodeficiency virus [HIV] disease: Secondary | ICD-10-CM

## 2016-05-22 ENCOUNTER — Ambulatory Visit: Payer: Self-pay | Admitting: Internal Medicine

## 2016-06-14 ENCOUNTER — Ambulatory Visit (INDEPENDENT_AMBULATORY_CARE_PROVIDER_SITE_OTHER): Payer: Self-pay | Admitting: Internal Medicine

## 2016-06-14 ENCOUNTER — Encounter: Payer: Self-pay | Admitting: Internal Medicine

## 2016-06-14 VITALS — BP 105/65 | HR 66 | Temp 98.2°F | Wt 172.0 lb

## 2016-06-14 DIAGNOSIS — R21 Rash and other nonspecific skin eruption: Secondary | ICD-10-CM

## 2016-06-14 DIAGNOSIS — B20 Human immunodeficiency virus [HIV] disease: Secondary | ICD-10-CM

## 2016-06-14 DIAGNOSIS — B353 Tinea pedis: Secondary | ICD-10-CM

## 2016-06-14 DIAGNOSIS — R600 Localized edema: Secondary | ICD-10-CM

## 2016-06-14 MED ORDER — NYSTATIN 100000 UNIT/GM EX POWD: Freq: Three times a day (TID) | CUTANEOUS | 3 refills | Status: DC
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
## 2016-06-15 LAB — T-HELPER CELL (CD4) - (RCID CLINIC ONLY)
CD4 T CELL HELPER: 26 % — AB (ref 33–55)
CD4 T Cell Abs: 290 /uL — ABNORMAL LOW (ref 400–2700)

## 2016-06-18 LAB — HIV-1 RNA QUANT-NO REFLEX-BLD
HIV 1 RNA QUANT: DETECTED {copies}/mL — AB
HIV-1 RNA QUANT, LOG: DETECTED {Log_copies}/mL — AB

## 2016-06-22 ENCOUNTER — Other Ambulatory Visit: Payer: Self-pay | Admitting: Internal Medicine

## 2016-06-22 DIAGNOSIS — B20 Human immunodeficiency virus [HIV] disease: Secondary | ICD-10-CM

## 2016-06-22 DIAGNOSIS — G63 Polyneuropathy in diseases classified elsewhere: Principal | ICD-10-CM

## 2016-06-22 NOTE — Telephone Encounter (Signed)
Medication phone to pharmacy.   Laverle Patter, RN

## 2016-08-13 ENCOUNTER — Other Ambulatory Visit: Payer: Self-pay | Admitting: Internal Medicine

## 2016-08-13 DIAGNOSIS — K219 Gastro-esophageal reflux disease without esophagitis: Secondary | ICD-10-CM

## 2016-09-06 ENCOUNTER — Other Ambulatory Visit: Payer: Self-pay | Admitting: Internal Medicine

## 2016-09-06 DIAGNOSIS — K219 Gastro-esophageal reflux disease without esophagitis: Secondary | ICD-10-CM

## 2016-10-02 ENCOUNTER — Ambulatory Visit (INDEPENDENT_AMBULATORY_CARE_PROVIDER_SITE_OTHER): Payer: Self-pay | Admitting: Internal Medicine

## 2016-10-02 ENCOUNTER — Encounter: Payer: Self-pay | Admitting: Internal Medicine

## 2016-10-02 ENCOUNTER — Ambulatory Visit (INDEPENDENT_AMBULATORY_CARE_PROVIDER_SITE_OTHER): Payer: Self-pay | Admitting: Licensed Clinical Social Worker

## 2016-10-02 VITALS — BP 117/76 | HR 63 | Temp 98.0°F | Wt 175.1 lb

## 2016-10-02 DIAGNOSIS — B20 Human immunodeficiency virus [HIV] disease: Secondary | ICD-10-CM

## 2016-10-02 DIAGNOSIS — T148XXA Other injury of unspecified body region, initial encounter: Secondary | ICD-10-CM

## 2016-10-02 DIAGNOSIS — R600 Localized edema: Secondary | ICD-10-CM

## 2016-10-02 DIAGNOSIS — F439 Reaction to severe stress, unspecified: Secondary | ICD-10-CM

## 2016-10-02 LAB — CBC WITH DIFFERENTIAL/PLATELET
BASOS ABS: 22 {cells}/uL (ref 0–200)
Basophils Relative: 1 %
EOS PCT: 17 %
Eosinophils Absolute: 374 cells/uL (ref 15–500)
HEMATOCRIT: 38.7 % (ref 35.0–45.0)
Hemoglobin: 13.2 g/dL (ref 11.7–15.5)
LYMPHS ABS: 1034 {cells}/uL (ref 850–3900)
LYMPHS PCT: 47 %
MCH: 32 pg (ref 27.0–33.0)
MCHC: 34.1 g/dL (ref 32.0–36.0)
MCV: 93.7 fL (ref 80.0–100.0)
MPV: 9.9 fL (ref 7.5–12.5)
Monocytes Absolute: 154 cells/uL — ABNORMAL LOW (ref 200–950)
Monocytes Relative: 7 %
NEUTROS PCT: 28 %
Neutro Abs: 616 cells/uL — ABNORMAL LOW (ref 1500–7800)
Platelets: 240 10*3/uL (ref 140–400)
RBC: 4.13 MIL/uL (ref 3.80–5.10)
RDW: 14.4 % (ref 11.0–15.0)
WBC: 2.2 10*3/uL — ABNORMAL LOW (ref 3.8–10.8)

## 2016-10-02 LAB — LIPID PANEL
CHOLESTEROL: 161 mg/dL (ref <200)
HDL: 52 mg/dL (ref >50)
LDL Cholesterol: 93 mg/dL (ref <100)
Total CHOL/HDL Ratio: 3.1 Ratio (ref <5.0)
Triglycerides: 80 mg/dL (ref <150)
VLDL: 16 mg/dL (ref <30)

## 2016-10-02 LAB — COMPLETE METABOLIC PANEL WITH GFR
ALBUMIN: 3.9 g/dL (ref 3.6–5.1)
ALK PHOS: 88 U/L (ref 33–115)
ALT: 11 U/L (ref 6–29)
AST: 18 U/L (ref 10–35)
BUN: 9 mg/dL (ref 7–25)
CALCIUM: 8.9 mg/dL (ref 8.6–10.2)
CHLORIDE: 105 mmol/L (ref 98–110)
CO2: 27 mmol/L (ref 20–31)
Creat: 0.66 mg/dL (ref 0.50–1.10)
GFR, Est African American: >89 mL/min (ref >=60)
Glucose, Bld: 95 mg/dL (ref 65–99)
POTASSIUM: 4.6 mmol/L (ref 3.5–5.3)
Sodium: 138 mmol/L (ref 135–146)
Total Bilirubin: 0.4 mg/dL (ref 0.2–1.2)
Total Protein: 7.8 g/dL (ref 6.1–8.1)
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
## 2016-10-02 NOTE — Progress Notes (Signed)
RFV: follow for hiv disease  Patient ID: Carolyn Williamson, female   DOB: June 19, 1970, 46 y.o.   MRN: [id number]  HPI Carolyn Williamson is 46yo F with HIV disease, CD 4 count 290/VL<20 on genvoya. Doing well with adherence. Overall her health has been good. She reports noticing lower extremity edema at the end  Of her work shift. She has been wearing compression stockings to help. She also notices some muscle soreness to her shoulders from heavy lifting at work.  Outpatient Encounter Prescriptions as of 10/02/2016  Medication Sig  . GENVOYA 150-150-200-10 MG TABS tablet TAKE 1 TABLET BY MOUTH DAILY WITH BREAKFAST  . ketotifen (ALLERGY EYE DROPS) 0.025 % ophthalmic solution Place 1 drop into both eyes 2 (two) times daily.  Marland Kitchen LYRICA 50 MG capsule TAKE ONE CAPSULE BY MOUTH EVERY DAY  . nystatin (MYCOSTATIN/NYSTOP) powder Apply topically 3 (three) times daily. appliquer entre vos orteils trois fois par [employer]  . omeprazole (PRILOSEC) 20 MG capsule TAKE ONE CAPSULE BY MOUTH DAILY  . sulfamethoxazole-trimethoprim (BACTRIM,SEPTRA) 400-80 MG tablet TAKE 1 TABLET BY MOUTH DAILY   No facility-administered encounter medications on file as of 10/02/2016.      Patient Active Problem List   Diagnosis Date Noted  . Bilateral leg edema 02/25/2014  . Abdominal muscle strain 01/29/2014  . Premature menopause 01/06/2014  . Seasonal allergies 01/06/2014  . Low back pain 11/30/2013  . Abdominal pain, epigastric 10/21/2013  . Myopia 10/21/2013  . Dental caries 10/21/2013  . Immigrant with language difficulty 09/14/2013  . Refugee health examination 09/14/2013  . HIV disease ([place]) 09/14/2013  . Acne vulgaris 09/14/2013   Soc hx: no smoking, or alcohol use  There are no preventive care reminders to display for this patient.   Review of Systems  Constitutional: Negative for fever, chills, diaphoresis, activity change, appetite change, fatigue and unexpected weight change.  HENT: Negative for congestion, sore  throat, rhinorrhea, sneezing, trouble swallowing and sinus pressure.  Eyes: Negative for photophobia and visual disturbance.  Respiratory: Negative for cough, chest tightness, shortness of breath, wheezing and stridor.  Cardiovascular: Negative for chest pain, palpitations and leg swelling.  Gastrointestinal: Negative for nausea, vomiting, abdominal pain, diarrhea, constipation, blood in stool, abdominal distention and anal bleeding.  Genitourinary: Negative for dysuria, hematuria, flank pain and difficulty urinating.  Musculoskeletal: + muscle aches near shoulders. Negative for myalgias, back pain, joint swelling, arthralgias and gait problem.  Skin: Negative for color change, pallor, rash and wound.  Neurological: Negative for dizziness, tremors, weakness and light-headedness.  Hematological: Negative for adenopathy. Does not bruise/bleed easily.  Psychiatric/Behavioral: Negative for behavioral problems, confusion, sleep disturbance, dysphoric mood, decreased concentration and agitation.    Physical Exam   BP 117/76   Pulse 63   Temp 98 F (36.7 C) (Oral)   Wt 175 lb 1.9 oz (79.4 kg)   BMI 27.43 kg/m   Physical Exam  Constitutional:  oriented to person, place, and time. appears well-developed and well-nourished. No distress.  HENT: /AT, PERRLA, no scleral icterus Mouth/Throat: Oropharynx is clear and moist. No oropharyngeal exudate.  Cardiovascular: Normal rate, regular rhythm and normal heart sounds. Exam reveals no gallop and no friction rub.  No murmur heard.  Pulmonary/Chest: Effort normal and breath sounds normal. No respiratory distress.  has no wheezes.  Lymphadenopathy: no cervical adenopathy. No axillary adenopathy Neurological: alert and oriented to person, place, and time.  Skin: Skin is warm and dry. No rash noted. No erythema.  Ext: no c/c/e, trace edema Psychiatric: a  normal mood and affect.  behavior is normal.   Lab Results  Component Value Date   CD4TCELL 26  (L) 06/14/2016   Lab Results  Component Value Date   CD4TABS 290 (L) 06/14/2016   CD4TABS 160 (L) 03/27/2016   CD4TABS 170 (L) 12/22/2015   Lab Results  Component Value Date   HIV1RNAQUANT <20 DETECTED (A) 06/14/2016   Lab Results  Component Value Date   HEPBSAB NEG 10/06/2013   Lab Results  Component Value Date   LABRPR NON REAC 01/11/2015    CBC Lab Results  Component Value Date   WBC 2.2 (L) 03/27/2016   RBC 4.14 03/27/2016   HGB 13.0 03/27/2016   HCT 38.4 03/27/2016   PLT 232 03/27/2016   MCV 92.8 03/27/2016   MCH 31.4 03/27/2016   MCHC 33.9 03/27/2016   RDW 14.5 03/27/2016   LYMPHSABS 748 (L) 03/27/2016   MONOABS 264 03/27/2016   EOSABS 242 03/27/2016    BMET Lab Results  Component Value Date   NA 138 03/27/2016   K 4.2 03/27/2016   CL 105 03/27/2016   CO2 28 03/27/2016   GLUCOSE 98 03/27/2016   BUN 11 03/27/2016   CREATININE 0.66 03/27/2016   CALCIUM 9.1 03/27/2016   GFRNONAA >89 03/27/2016   GFRAA >89 03/27/2016      Assessment and Plan  hiv = will check labs but plan to continue on genvoya  msk = try bengay cream to help her symptoms  Pitting edema to LE = continue with compression stockings  Depression = she reports speaking with sherrie, clinic counselor, to help with symptoms of depression associated to trauma from refugee camp in [place]

## 2016-10-03 LAB — RPR

## 2016-10-04 LAB — T-HELPER CELL (CD4) - (RCID CLINIC ONLY)
CD4 T CELL ABS: 290 /uL — AB (ref 400–2700)
CD4 T CELL HELPER: 26 % — AB (ref 33–55)

## 2016-10-05 ENCOUNTER — Other Ambulatory Visit: Payer: Self-pay | Admitting: Internal Medicine

## 2016-10-05 DIAGNOSIS — B20 Human immunodeficiency virus [HIV] disease: Secondary | ICD-10-CM

## 2016-10-05 DIAGNOSIS — K219 Gastro-esophageal reflux disease without esophagitis: Secondary | ICD-10-CM

## 2016-10-05 LAB — HIV-1 RNA QUANT-NO REFLEX-BLD
HIV 1 RNA QUANT: NOT DETECTED {copies}/mL
HIV-1 RNA QUANT, LOG: NOT DETECTED {Log_copies}/mL

## 2016-10-09 ENCOUNTER — Ambulatory Visit (INDEPENDENT_AMBULATORY_CARE_PROVIDER_SITE_OTHER): Payer: Self-pay | Admitting: Licensed Clinical Social Worker

## 2016-10-09 DIAGNOSIS — F439 Reaction to severe stress, unspecified: Secondary | ICD-10-CM

## 2016-10-09 NOTE — Progress Notes (Signed)
Integrated Behavioral Health Follow Up Visit  MRN: [id number] Name: Emilly Lavey   Session Start time: 8:48 am Session End time: 9:50 am Total time: 1 hour Number of Integrated Behavioral Health Clinician visits: 2/10  Type of Service: [place] Interpretor:Yes.   Interpretor Name and Language: [place] through [employer] Completed.       SUBJECTIVE: Jamelyn Bovard is a 46 y.o. female accompanied by patient. Patient was referred by Dr. Baxter Flattery for possible depressive and trauma symptoms.  [facility] attempted to administer two assessments to measure PTSD symptoms and anxiety in English, however was not successful.  The patient was initially resistant to using a [place] interpreter and answered the questions asked in [place].  The [facility] took additional time to ask each question in [place] and explain concepts.  Patient reported that she could understand the questions asked in [place] but the [facility] was clear that the patient was having difficulty.  The patient completed both questionnaires and the [facility] attempted to explain the results and the patient did not comprehend.  The [facility] recommended that a translator be used for the session and the patient was again hesitant.  The [facility] questioned the client about why she did not want the translator when she was not understanding the concepts explained to her in [place].  The patient reported that she felt uncomfortable to share her difficulties with in-person translators and stated that she would use an interpreter over the phone.  The [facility] used the [facility] interpreter service and placed the call on speaker phone.  The interpreter was able to explain the results of the assessments and the patient began to deny her previous responses.  Due to the amount of time elapsed completing the initial questionnaires, there was no time to re-do the assessments with the aide of the interpreter.  At the next visit, the assessments  will be completed again, with the aide of the interpreter.  Duration of problem: 3 years; Severity of problem: mild  OBJECTIVE: Mood: Anxious and Affect: within range Risk of harm to self or others: No plan to harm self or others  Thought process:tangential Thought content: logical  ASSESSMENT: [facility] was unable to accurately complete assessments to determine patient's symptoms.  Another set of assessments will be completed to determine symptoms, with a [place] interpreter.  GOALS ADDRESSED: Patient will reduce symptoms of: anxiety and increase knowledge and/or ability of: coping skills and healthy habits and also: Increase healthy adjustment to current life circumstances  INTERVENTIONS: Standardized Assessments completed: GAD-7 and PCL-C.  PLAN: 1. Follow up with behavioral health clinician on : Tuesday Aug 7th at 8:45 am 2. Assessments will be administered again using an interpreter.  Sande Rives, [facility]

## 2016-10-10 ENCOUNTER — Telehealth: Payer: Self-pay

## 2016-10-10 DIAGNOSIS — G63 Polyneuropathy in diseases classified elsewhere: Principal | ICD-10-CM

## 2016-10-10 DIAGNOSIS — B20 Human immunodeficiency virus [HIV] disease: Secondary | ICD-10-CM

## 2016-10-10 MED ORDER — PREGABALIN 50 MG PO CAPS: 50.0000 mg | ORAL_CAPSULE | Freq: Every day | ORAL | 3 refills | Status: DC

## 2016-10-10 NOTE — Telephone Encounter (Signed)
Pharmacy refill request on Lyrica and Nystatin topical powder . Refills called to pharmacy.   Laverle Patter, RN

## 2016-10-16 ENCOUNTER — Ambulatory Visit (INDEPENDENT_AMBULATORY_CARE_PROVIDER_SITE_OTHER): Payer: Self-pay | Admitting: Licensed Clinical Social Worker

## 2016-10-16 DIAGNOSIS — Z6981 Encounter for mental health services for victim of other abuse: Secondary | ICD-10-CM

## 2016-10-16 NOTE — Progress Notes (Signed)
Integrated Behavioral Health Follow Up Visit  MRN: [id number] Name: Carolyn Williamson   Session Start time: 8:48 am Session End time: 9:46 am Total time: 1 hour Number of Integrated Behavioral Health Clinician visits: 3/10  Type of Service: [facility] Interpretor:Yes.   Interpretor Name and Language: Murray Hodgkins- [place]   Warm Hand Off Completed.       SUBJECTIVE: Carolyn Williamson is a 46 y.o. female accompanied by patient. Patient was referred by Dr. Baxter Flattery for trauma symptoms.  Patient was administered the Anxiety and Trauma measures again in [place] and she was able to complete with assistance.  Patient required instruction in describing terms such as "anxiety", "irritable", "emotional numbing", and "reliving".  Sac explained these constructs.  Patient also presented with difficulty answering with the appropriate response options and wanted to elaborate and provide detail.  Patient was often reminded that she should answer with the appropriate response options.  Patient scored a 5 on the GAD-7 evidencing little anxiety.  Patient scored a 26 on the PCL-C evidencing little experience of trauma or mood disturbance.  Results were explained to the patient and she was questioned whether she thought she needed counseling to address other issues and the patient denied and stated not right now.  Patient stated that she would hold onto [facility] business card and call if/when she thinks it is needed.  [facility] also reported on research about the benefits of conducting counseling in the patient's native language and suggested locating a [place] speaking counselor in the community to assist with cultural differences in language.  Duration of problem: 3 years; Severity of problem: mild  OBJECTIVE: Mood: Anxious and Affect: within range Risk of harm to self or others: No plan to harm self or others  ASSESSMENT: Patient was administered two questionnaires to check for trauma and mood  disorder symptoms and scored very low, evidencing lack of symptoms.  If patient decides to engage in counseling she may benefit from a [place] Scientist, product/process development.  GOALS ADDRESSED: Patient will reduce symptoms of: anxiety and increase knowledge and/or ability of: coping skills and also: Increase healthy adjustment to current life circumstances  INTERVENTIONS: Standardized Assessments completed: GAD-7 and PCL-C.  GAD 7 : Generalized Anxiety Score 10/16/2016 10/09/2016  Nervous, Anxious, on Edge 0 2  Control/stop worrying 0 0  Worry too much - different things 2 0  Trouble relaxing 2 0  Restless 1 0  Easily annoyed or irritable 0 0  Afraid - awful might happen 0 0  Total GAD 7 Score 5 2  Anxiety Difficulty Not difficult at all Not difficult at all   **  It should be noted that all measurements completed in the Screening assessment history, if not completed in [place] with the aid of an interpreter, the results may be skewed.  It has been shown that the patient will allow an [place] speaking clinician to ask questions, and will attempt to answer without fully understanding the questions.  PLAN: 1. Behavioral recommendations: If patient begins to experience mental health symptoms, she will call [facility] for appointment or referral.  Sande Rives, [facility]

## 2016-11-20 ENCOUNTER — Encounter: Payer: Self-pay | Admitting: Internal Medicine

## 2017-01-15 ENCOUNTER — Ambulatory Visit (INDEPENDENT_AMBULATORY_CARE_PROVIDER_SITE_OTHER): Payer: Self-pay | Admitting: Internal Medicine

## 2017-01-15 VITALS — BP 110/73 | HR 71 | Temp 98.1°F | Wt 184.0 lb

## 2017-01-15 DIAGNOSIS — Z23 Encounter for immunization: Secondary | ICD-10-CM

## 2017-01-15 DIAGNOSIS — B20 Human immunodeficiency virus [HIV] disease: Secondary | ICD-10-CM

## 2017-01-15 DIAGNOSIS — H524 Presbyopia: Secondary | ICD-10-CM

## 2017-01-15 LAB — CBC WITH DIFFERENTIAL/PLATELET
BASOS ABS: 19 {cells}/uL (ref 0–200)
Basophils Relative: 0.8 %
EOS ABS: 422 {cells}/uL (ref 15–500)
EOS PCT: 17.6 %
HCT: 36 % (ref 35.0–45.0)
HEMOGLOBIN: 12.4 g/dL (ref 11.7–15.5)
LYMPHS ABS: 1013 {cells}/uL (ref 850–3900)
MCH: 31.5 pg (ref 27.0–33.0)
MCHC: 34.4 g/dL (ref 32.0–36.0)
MCV: 91.4 fL (ref 80.0–100.0)
MPV: 10.6 fL (ref 7.5–12.5)
Monocytes Relative: 11.9 %
NEUTROS ABS: 660 {cells}/uL — AB (ref 1500–7800)
Neutrophils Relative %: 27.5 %
Platelets: 254 10*3/uL (ref 140–400)
RBC: 3.94 10*6/uL (ref 3.80–5.10)
RDW: 12.9 % (ref 11.0–15.0)
Total Lymphocyte: 42.2 %
WBC: 2.4 10*3/uL — ABNORMAL LOW (ref 3.8–10.8)
WBCMIX: 286 {cells}/uL (ref 200–950)

## 2017-01-15 LAB — COMPLETE METABOLIC PANEL WITH GFR
AG Ratio: 1 (calc) (ref 1.0–2.5)
ALBUMIN MSPROF: 3.8 g/dL (ref 3.6–5.1)
ALKALINE PHOSPHATASE (APISO): 96 U/L (ref 33–115)
ALT: 12 U/L (ref 6–29)
AST: 17 U/L (ref 10–35)
BUN: 12 mg/dL (ref 7–25)
CALCIUM: 9.1 mg/dL (ref 8.6–10.2)
CO2: 27 mmol/L (ref 20–32)
CREATININE: 0.71 mg/dL (ref 0.50–1.10)
Chloride: 106 mmol/L (ref 98–110)
GFR, Est African American: 118 mL/min/{1.73_m2} (ref >=60)
GFR, Est Non African American: 102 mL/min/{1.73_m2} (ref >=60)
GLUCOSE: 98 mg/dL (ref 65–99)
Globulin: 3.9 g/dL (calc) — ABNORMAL HIGH (ref 1.9–3.7)
Potassium: 4.8 mmol/L (ref 3.5–5.3)
Sodium: 137 mmol/L (ref 135–146)
Total Bilirubin: 0.3 mg/dL (ref 0.2–1.2)
Total Protein: 7.7 g/dL (ref 6.1–8.1)

## 2017-01-15 NOTE — Progress Notes (Signed)
RFV: follow up for hiv disease  Patient ID: Carolyn Williamson, female   DOB: 11/01/1970, 46 y.o.   MRN: [id number]  HPI 46yo F with hiv disease, CD 4 count of 290.VL<20 on genvoya. Doing well with her adherence to medications. No recent issues with health other than difficulty with vision. She has lost her bifocals that she received roughly 2 years ago.  Outpatient Encounter Medications as of 01/15/2017  Medication Sig  . GENVOYA 150-150-200-10 MG TABS tablet TAKE 1 TABLET BY MOUTH DAILY WITH BREAKFAST  . nystatin (MYCOSTATIN/NYSTOP) powder Apply topically 3 (three) times daily. appliquer entre vos orteils trois fois par [employer]  . omeprazole (PRILOSEC) 20 MG capsule TAKE ONE CAPSULE BY MOUTH DAILY  . pregabalin (LYRICA) 50 MG capsule Take 1 capsule (50 mg total) by mouth daily.  Marland Kitchen sulfamethoxazole-trimethoprim (BACTRIM,SEPTRA) 400-80 MG tablet TAKE 1 TABLET BY MOUTH DAILY  . ketotifen (ALLERGY EYE DROPS) 0.025 % ophthalmic solution Place 1 drop into both eyes 2 (two) times daily. (Patient not taking: Reported on 01/15/2017)   No facility-administered encounter medications on file as of 01/15/2017.      Patient Active Problem List   Diagnosis Date Noted  . Bilateral leg edema 02/25/2014  . Abdominal muscle strain 01/29/2014  . Premature menopause 01/06/2014  . Seasonal allergies 01/06/2014  . Low back pain 11/30/2013  . Abdominal pain, epigastric 10/21/2013  . Myopia 10/21/2013  . Dental caries 10/21/2013  . Immigrant with language difficulty 09/14/2013  . Refugee health examination 09/14/2013  . HIV disease ([place]) 09/14/2013  . Acne vulgaris 09/14/2013     Health Maintenance Due  Topic Date Due  . INFLUENZA VACCINE  10/10/2016     Review of Systems Review of Systems  Constitutional: Negative for fever, chills, diaphoresis, activity change, appetite change, fatigue and unexpected weight change.  HENT: Negative for congestion, sore throat, rhinorrhea, sneezing, trouble swallowing  and sinus pressure.  Eyes: + vision issues both distance and upclose Respiratory: Negative for cough, chest tightness, shortness of breath, wheezing and stridor.  Cardiovascular: Negative for chest pain, palpitations and leg swelling.  Gastrointestinal: Negative for nausea, vomiting, abdominal pain, diarrhea, constipation, blood in stool, abdominal distention and anal bleeding.  Genitourinary: Negative for dysuria, hematuria, flank pain and difficulty urinating.  Musculoskeletal: Negative for myalgias, back pain, joint swelling, arthralgias and gait problem.  Skin: Negative for color change, pallor, rash and wound.  Neurological: Negative for dizziness, tremors, weakness and light-headedness.  Hematological: Negative for adenopathy. Does not bruise/bleed easily.  Psychiatric/Behavioral: Negative for behavioral problems, confusion, sleep disturbance, dysphoric mood, decreased concentration and agitation.    Physical Exam   BP 110/73   Pulse 71   Temp 98.1 F (36.7 C) (Oral)   Wt 184 lb (83.5 kg)   LMP 05/09/2013   BMI 28.82 kg/m   Physical Exam  Constitutional:  oriented to person, place, and time. appears well-developed and well-nourished. No distress.  HENT: [place]/AT, PERRLA, no scleral icterus Mouth/Throat: Oropharynx is clear and moist. No oropharyngeal exudate.  Cardiovascular: Normal rate, regular rhythm and normal heart sounds. Exam reveals no gallop and no friction rub.  No murmur heard.  Pulmonary/Chest: Effort normal and breath sounds normal. No respiratory distress.  has no wheezes.  Neck = supple, no nuchal rigidity Lymphadenopathy: no cervical adenopathy. No axillary adenopathy Neurological: alert and oriented to person, place, and time.  Skin: Skin is warm and dry. No rash noted. No erythema.  Psychiatric: a normal mood and affect.  behavior is normal.  Lab Results  Component Value Date   CD4TCELL 26 (L) 10/02/2016   Lab Results  Component Value Date   CD4TABS 290  (L) 10/02/2016   CD4TABS 290 (L) 06/14/2016   CD4TABS 160 (L) 03/27/2016   Lab Results  Component Value Date   HIV1RNAQUANT <20 NOT DETECTED 10/02/2016   Lab Results  Component Value Date   HEPBSAB NEG 10/06/2013   Lab Results  Component Value Date   LABRPR NON REAC 10/02/2016    CBC Lab Results  Component Value Date   WBC 2.2 (L) 10/02/2016   RBC 4.13 10/02/2016   HGB 13.2 10/02/2016   HCT 38.7 10/02/2016   PLT 240 10/02/2016   MCV 93.7 10/02/2016   MCH 32.0 10/02/2016   MCHC 34.1 10/02/2016   RDW 14.4 10/02/2016   LYMPHSABS 1,034 10/02/2016   MONOABS 154 (L) 10/02/2016   EOSABS 374 10/02/2016    BMET Lab Results  Component Value Date   NA 138 10/02/2016   K 4.6 10/02/2016   CL 105 10/02/2016   CO2 27 10/02/2016   GLUCOSE 95 10/02/2016   BUN 9 10/02/2016   CREATININE 0.66 10/02/2016   CALCIUM 8.9 10/02/2016   GFRNONAA >89 10/02/2016   GFRAA >89 10/02/2016      Assessment and Plan  hiv disease = will check labs and have her continue with current regimen  Presbyopia = will [facility] or local [facility] for low cost screening. thp can provide free eyewear  Health maintenance = will get flu shot today

## 2017-01-15 NOTE — Patient Instructions (Signed)
Continue with genvoya

## 2017-01-16 LAB — T-HELPER CELL (CD4) - (RCID CLINIC ONLY)
CD4 % Helper T Cell: 24 % — ABNORMAL LOW (ref 33–55)
CD4 T CELL ABS: 260 /uL — AB (ref 400–2700)

## 2017-01-17 LAB — HIV-1 RNA QUANT-NO REFLEX-BLD
HIV 1 RNA Quant: <20 copies/mL
HIV-1 RNA Quant, Log: <1.3 Log copies/mL

## 2017-01-25 ENCOUNTER — Telehealth: Payer: Self-pay | Admitting: *Deleted

## 2017-01-25 NOTE — Telephone Encounter (Signed)
Patient called pharmacy, requesting refill of Lyrica 50mg  #30. She was given #30 with 3 refills 8/1, but has been filling them as early as possible, before her 30 days is up. #30 day refill called in. Please advise if ok to continue to filling as written, or if patient should have an appointment to adjust this. Landis Gandy, RN

## 2017-01-25 NOTE — Telephone Encounter (Signed)
Can do bid dosing

## 2017-01-29 ENCOUNTER — Other Ambulatory Visit: Payer: Self-pay | Admitting: *Deleted

## 2017-01-29 DIAGNOSIS — G63 Polyneuropathy in diseases classified elsewhere: Principal | ICD-10-CM

## 2017-01-29 DIAGNOSIS — B20 Human immunodeficiency virus [HIV] disease: Secondary | ICD-10-CM

## 2017-01-29 MED ORDER — PREGABALIN 50 MG PO CAPS: 50.0000 mg | ORAL_CAPSULE | Freq: Two times a day (BID) | ORAL | 1 refills | Status: DC

## 2017-01-29 NOTE — Telephone Encounter (Signed)
Lyrica 50 mg  Twice daily #60 with 1 refill called into [facility]. They will send to the patient.

## 2017-03-19 ENCOUNTER — Other Ambulatory Visit: Payer: Self-pay | Admitting: Internal Medicine

## 2017-03-19 DIAGNOSIS — G63 Polyneuropathy in diseases classified elsewhere: Secondary | ICD-10-CM

## 2017-03-19 DIAGNOSIS — K219 Gastro-esophageal reflux disease without esophagitis: Secondary | ICD-10-CM

## 2017-03-19 DIAGNOSIS — B20 Human immunodeficiency virus [HIV] disease: Secondary | ICD-10-CM

## 2017-04-16 ENCOUNTER — Other Ambulatory Visit: Payer: Self-pay | Admitting: Internal Medicine

## 2017-04-16 DIAGNOSIS — B20 Human immunodeficiency virus [HIV] disease: Secondary | ICD-10-CM

## 2017-04-23 ENCOUNTER — Encounter: Payer: Self-pay | Admitting: Internal Medicine

## 2017-04-23 ENCOUNTER — Ambulatory Visit (INDEPENDENT_AMBULATORY_CARE_PROVIDER_SITE_OTHER): Payer: BLUE CROSS/BLUE SHIELD | Admitting: Internal Medicine

## 2017-04-23 ENCOUNTER — Ambulatory Visit: Payer: BLUE CROSS/BLUE SHIELD

## 2017-04-23 VITALS — BP 116/76 | HR 57 | Temp 97.8°F | Wt 183.0 lb

## 2017-04-23 DIAGNOSIS — Z79899 Other long term (current) drug therapy: Secondary | ICD-10-CM | POA: Diagnosis not present

## 2017-04-23 DIAGNOSIS — B20 Human immunodeficiency virus [HIV] disease: Secondary | ICD-10-CM | POA: Diagnosis not present

## 2017-04-23 DIAGNOSIS — R21 Rash and other nonspecific skin eruption: Secondary | ICD-10-CM | POA: Diagnosis not present

## 2017-04-23 MED ORDER — TRIAMCINOLONE ACETONIDE 0.025 % EX OINT: 1.0000 "application " | TOPICAL_OINTMENT | Freq: Two times a day (BID) | CUTANEOUS | 0 refills | Status: DC

## 2017-04-23 MED ORDER — TRIAMCINOLONE ACETONIDE 0.1 % EX CREA: 1.0000 "application " | TOPICAL_CREAM | Freq: Two times a day (BID) | CUTANEOUS | 0 refills | Status: DC

## 2017-04-23 NOTE — Patient Instructions (Signed)
Please get labs 2 wk before your next appointment

## 2017-04-23 NOTE — Progress Notes (Signed)
RFV: follow up for hiv disease  Patient ID: Carolyn Williamson, female   DOB: 1970/09/09, 47 y.o.   MRN: [id number]  HPI [name] is a 47yo F with well controlled hiv disease, CD 4 count of 260/VL<20 on genvoya. She is doing well with adherence. She does notice that she has occasional rash, that is aggravating, often having pruritic component. Unclear cause of her dermatitis. She states that she has not had any new clothing or new laundry detergent  Outpatient Encounter Medications as of 04/23/2017  Medication Sig  . GENVOYA 150-150-200-10 MG TABS tablet TAKE 1 TABLET BY MOUTH DAILY WITH BREAKFAST  . ketotifen (ALLERGY EYE DROPS) 0.025 % ophthalmic solution Place 1 drop into both eyes 2 (two) times daily.  Marland Kitchen LYRICA 50 MG capsule TAKE ONE CAPSULE BY MOUTH TWICE DAILY  . nystatin (MYCOSTATIN/NYSTOP) powder Apply topically 3 (three) times daily. appliquer entre vos orteils trois fois par [employer]  . omeprazole (PRILOSEC) 20 MG capsule TAKE ONE CAPSULE BY MOUTH DAILY  . sulfamethoxazole-trimethoprim (BACTRIM,SEPTRA) 400-80 MG tablet TAKE 1 TABLET BY MOUTH DAILY   No facility-administered encounter medications on file as of 04/23/2017.      Patient Active Problem List   Diagnosis Date Noted  . Bilateral leg edema 02/25/2014  . Abdominal muscle strain 01/29/2014  . Premature menopause 01/06/2014  . Seasonal allergies 01/06/2014  . Low back pain 11/30/2013  . Abdominal pain, epigastric 10/21/2013  . Myopia 10/21/2013  . Dental caries 10/21/2013  . Immigrant with language difficulty 09/14/2013  . Refugee health examination 09/14/2013  . HIV disease ([place]) 09/14/2013  . Acne vulgaris 09/14/2013     There are no preventive care reminders to display for this patient.   Review of Systems +rash, otherwise 12 point ros is negative Physical Exam   BP 116/76   Pulse (!) 57   Temp 97.8 F (36.6 C) (Oral)   Wt 183 lb (83 kg)   LMP 05/09/2013   BMI 28.66 kg/m   Physical Exam  Constitutional:   oriented to person, place, and time. appears well-developed and well-nourished. No distress.  HENT: [place]/AT, PERRLA, no scleral icterus Mouth/Throat: Oropharynx is clear and moist. No oropharyngeal exudate.  Cardiovascular: Normal rate, regular rhythm and normal heart sounds. Exam reveals no gallop and no friction rub.  No murmur heard.  Pulmonary/Chest: Effort normal and breath sounds normal. No respiratory distress.  has no wheezes.  Neck = supple, no nuchal rigidity Abdominal: Soft. Bowel sounds are normal.  exhibits no distension. There is no tenderness.  Lymphadenopathy: no cervical adenopathy. No axillary adenopathy Neurological: alert and oriented to person, place, and time.  Skin: Skin is warm and dry. Mildly hyperpigmentated affected area of arm but not raised. Psychiatric: a normal mood and affect.  behavior is normal.   Lab Results  Component Value Date   CD4TCELL 24 (L) 01/15/2017   Lab Results  Component Value Date   CD4TABS 260 (L) 01/15/2017   CD4TABS 290 (L) 10/02/2016   CD4TABS 290 (L) 06/14/2016   Lab Results  Component Value Date   HIV1RNAQUANT <20 NOT DETECTED 01/15/2017   Lab Results  Component Value Date   HEPBSAB NEG 10/06/2013   Lab Results  Component Value Date   LABRPR NON REAC 10/02/2016    CBC Lab Results  Component Value Date   WBC 2.4 (L) 01/15/2017   RBC 3.94 01/15/2017   HGB 12.4 01/15/2017   HCT 36.0 01/15/2017   PLT 254 01/15/2017   MCV 91.4 01/15/2017  MCH 31.5 01/15/2017   MCHC 34.4 01/15/2017   RDW 12.9 01/15/2017   LYMPHSABS 1,013 01/15/2017   MONOABS 154 (L) 10/02/2016   EOSABS 422 01/15/2017    BMET Lab Results  Component Value Date   NA 137 01/15/2017   K 4.8 01/15/2017   CL 106 01/15/2017   CO2 27 01/15/2017   GLUCOSE 98 01/15/2017   BUN 12 01/15/2017   CREATININE 0.71 01/15/2017   CALCIUM 9.1 01/15/2017   GFRNONAA 102 01/15/2017   GFRAA 118 01/15/2017      Assessment and Plan  hiv disease= well  controlled. Will check labs at next visit at 6 months.  Dermatitis = will give triamcinilone cream to see if that helps her outbreaks  Long term drug monitoring = cr function is stable. Will continue on current ART regimen

## 2017-04-25 ENCOUNTER — Encounter: Payer: Self-pay | Admitting: Internal Medicine

## 2017-05-10 ENCOUNTER — Other Ambulatory Visit: Payer: Self-pay | Admitting: Internal Medicine

## 2017-05-10 ENCOUNTER — Telehealth: Payer: Self-pay | Admitting: *Deleted

## 2017-05-10 DIAGNOSIS — B20 Human immunodeficiency virus [HIV] disease: Secondary | ICD-10-CM

## 2017-05-10 NOTE — Telephone Encounter (Signed)
Patient requested refill of bactrim. Last 3 cd4 counts >200. Please advise if she still needs to be on bactrim DS. Landis Gandy, RN

## 2017-05-13 ENCOUNTER — Other Ambulatory Visit: Payer: Self-pay

## 2017-05-13 DIAGNOSIS — B20 Human immunodeficiency virus [HIV] disease: Secondary | ICD-10-CM

## 2017-05-13 MED ORDER — SULFAMETHOXAZOLE-TRIMETHOPRIM 400-80 MG PO TABS: 1.0000 | ORAL_TABLET | Freq: Every day | ORAL | 0 refills | Status: DC

## 2017-05-13 NOTE — Telephone Encounter (Signed)
Septra DS refill request.  Medication sent to mail order pharmacy.   Laverle Patter, RN

## 2017-05-13 NOTE — Telephone Encounter (Signed)
She no longer needs bactrim.

## 2017-05-14 ENCOUNTER — Other Ambulatory Visit: Payer: Self-pay | Admitting: *Deleted

## 2017-05-14 NOTE — Telephone Encounter (Signed)
Removed Bactrim from her med list.

## 2017-05-27 ENCOUNTER — Encounter: Payer: Self-pay | Admitting: *Deleted

## 2017-05-27 ENCOUNTER — Telehealth: Payer: Self-pay | Admitting: *Deleted

## 2017-05-27 DIAGNOSIS — G63 Polyneuropathy in diseases classified elsewhere: Secondary | ICD-10-CM

## 2017-05-27 DIAGNOSIS — B20 Human immunodeficiency virus [HIV] disease: Secondary | ICD-10-CM | POA: Insufficient documentation

## 2017-05-27 NOTE — Telephone Encounter (Signed)
PA sent via Cover My Meds at the request of [facility].  Requested urgent review. Landis Gandy, RN

## 2017-07-22 ENCOUNTER — Other Ambulatory Visit: Payer: BLUE CROSS/BLUE SHIELD

## 2017-07-22 DIAGNOSIS — B20 Human immunodeficiency virus [HIV] disease: Secondary | ICD-10-CM

## 2017-07-22 LAB — COMPLETE METABOLIC PANEL WITH GFR
AG RATIO: 1.1 (calc) (ref 1.0–2.5)
ALKALINE PHOSPHATASE (APISO): 95 U/L (ref 33–115)
ALT: 10 U/L (ref 6–29)
AST: 18 U/L (ref 10–35)
Albumin: 3.9 g/dL (ref 3.6–5.1)
BILIRUBIN TOTAL: 0.3 mg/dL (ref 0.2–1.2)
BUN: 12 mg/dL (ref 7–25)
CHLORIDE: 105 mmol/L (ref 98–110)
CO2: 25 mmol/L (ref 20–32)
Calcium: 9.1 mg/dL (ref 8.6–10.2)
Creat: 0.65 mg/dL (ref 0.50–1.10)
GFR, EST AFRICAN AMERICAN: 123 mL/min/{1.73_m2} (ref >=60)
GFR, Est Non African American: 106 mL/min/{1.73_m2} (ref >=60)
GLOBULIN: 3.5 g/dL (ref 1.9–3.7)
GLUCOSE: 101 mg/dL — AB (ref 65–99)
POTASSIUM: 4.5 mmol/L (ref 3.5–5.3)
SODIUM: 138 mmol/L (ref 135–146)
TOTAL PROTEIN: 7.4 g/dL (ref 6.1–8.1)

## 2017-07-22 LAB — CBC WITH DIFFERENTIAL/PLATELET
BASOS ABS: 19 {cells}/uL (ref 0–200)
Basophils Relative: 0.7 %
EOS PCT: 8.2 %
Eosinophils Absolute: 221 cells/uL (ref 15–500)
HCT: 36.8 % (ref 35.0–45.0)
Hemoglobin: 12.6 g/dL (ref 11.7–15.5)
Lymphs Abs: 1061 cells/uL (ref 850–3900)
MCH: 30.8 pg (ref 27.0–33.0)
MCHC: 34.2 g/dL (ref 32.0–36.0)
MCV: 90 fL (ref 80.0–100.0)
MPV: 10.5 fL (ref 7.5–12.5)
Monocytes Relative: 13.5 %
NEUTROS PCT: 38.3 %
Neutro Abs: 1034 cells/uL — ABNORMAL LOW (ref 1500–7800)
PLATELETS: 247 10*3/uL (ref 140–400)
RBC: 4.09 10*6/uL (ref 3.80–5.10)
RDW: 13 % (ref 11.0–15.0)
TOTAL LYMPHOCYTE: 39.3 %
WBC mixed population: 365 cells/uL (ref 200–950)
WBC: 2.7 10*3/uL — AB (ref 3.8–10.8)

## 2017-07-23 LAB — T-HELPER CELL (CD4) - (RCID CLINIC ONLY)
CD4 % Helper T Cell: 26 % — ABNORMAL LOW (ref 33–55)
CD4 T Cell Abs: 280 /uL — ABNORMAL LOW (ref 400–2700)

## 2017-07-24 LAB — HIV-1 RNA QUANT-NO REFLEX-BLD
HIV 1 RNA QUANT: NOT DETECTED {copies}/mL
HIV-1 RNA QUANT, LOG: NOT DETECTED {Log_copies}/mL

## 2017-08-12 ENCOUNTER — Encounter: Payer: Self-pay | Admitting: Internal Medicine

## 2017-08-13 ENCOUNTER — Ambulatory Visit (INDEPENDENT_AMBULATORY_CARE_PROVIDER_SITE_OTHER): Payer: BLUE CROSS/BLUE SHIELD | Admitting: Internal Medicine

## 2017-08-13 ENCOUNTER — Other Ambulatory Visit: Payer: Self-pay | Admitting: Internal Medicine

## 2017-08-13 ENCOUNTER — Encounter: Payer: Self-pay | Admitting: Internal Medicine

## 2017-08-13 VITALS — BP 118/73 | HR 64 | Temp 97.6°F | Ht 64.0 in | Wt 179.0 lb

## 2017-08-13 DIAGNOSIS — B20 Human immunodeficiency virus [HIV] disease: Secondary | ICD-10-CM | POA: Diagnosis not present

## 2017-08-13 DIAGNOSIS — G63 Polyneuropathy in diseases classified elsewhere: Secondary | ICD-10-CM | POA: Diagnosis not present

## 2017-08-13 DIAGNOSIS — R1013 Epigastric pain: Secondary | ICD-10-CM

## 2017-08-13 DIAGNOSIS — Z23 Encounter for immunization: Secondary | ICD-10-CM | POA: Diagnosis not present

## 2017-08-13 MED ORDER — ELVITEG-COBIC-EMTRICIT-TENOFAF 150-150-200-10 MG PO TABS: 1.0000 | ORAL_TABLET | Freq: Every day | ORAL | 5 refills | Status: DC

## 2017-08-13 MED ORDER — PREGABALIN 50 MG PO CAPS
50.0000 mg | ORAL_CAPSULE | Freq: Two times a day (BID) | ORAL | 2 refills | Status: DC
Start: 2017-08-13 — End: 2017-08-14

## 2017-08-13 NOTE — Progress Notes (Signed)
Patient ID: Carolyn Williamson, female   DOB: [birth year], 47 y.o.   MRN: [id number]  HPI 47 yo F with hiv disease,CD 4 count of 280/VL<20,(may 2019) on genvoya. She reports noticing occasional Epigastric pain, despite taking acid-reflux medication  Still having neuropathy to feet but has ran out of lyrica, which did help her symptoms in the past    Outpatient Encounter Medications as of 08/13/2017  Medication Sig  . GENVOYA 150-150-200-10 MG TABS tablet TAKE 1 TABLET BY MOUTH DAILY WITH BREAKFAST  . ketotifen (ALLERGY EYE DROPS) 0.025 % ophthalmic solution Place 1 drop into both eyes 2 (two) times daily.  Marland Kitchen LYRICA 50 MG capsule TAKE ONE CAPSULE BY MOUTH TWICE DAILY  . nystatin (MYCOSTATIN/NYSTOP) powder Apply topically 3 (three) times daily. appliquer entre vos orteils trois fois par [employer]  . omeprazole (PRILOSEC) 20 MG capsule TAKE ONE CAPSULE BY MOUTH DAILY  . triamcinolone cream (KENALOG) 0.1 % Apply 1 application topically 2 (two) times daily.   No facility-administered encounter medications on file as of 08/13/2017.      Patient Active Problem List   Diagnosis Date Noted  . 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 Bilateral leg edema 02/25/2014  . Abdominal muscle strain 01/29/2014  . Premature menopause 01/06/2014  . Seasonal allergies 01/06/2014  . Low back pain 11/30/2013  . Abdominal pain, epigastric 10/21/2013  . Myopia 10/21/2013  . Dental caries 10/21/2013  . Immigrant with language difficulty 09/14/2013  . Refugee health examination 09/14/2013  . HIV disease ([place]) 09/14/2013  . Acne vulgaris 09/14/2013     There are no preventive care reminders to display for this patient.   Review of Systems 12 point ros is negative except what is mentioned in hpi Physical Exam   BP 118/73   Pulse 64   Temp 97.6 F (36.4 C) (Oral)   Ht 5\' 4"  (1.626 m)   Wt 179 lb (81.2 kg)   LMP 05/09/2013   BMI 30.73 kg/m   Physical Exam  Constitutional:  oriented to person,  place, and time. appears well-developed and well-nourished. No distress.  HENT: [place]/AT, PERRLA, no scleral icterus Mouth/Throat: Oropharynx is clear and moist. No oropharyngeal exudate.  Cardiovascular: Normal rate, regular rhythm and normal heart sounds. Exam reveals no gallop and no friction rub.  No murmur heard.  Pulmonary/Chest: Effort normal and breath sounds normal. No respiratory distress.  has no wheezes.  Neck = supple, no nuchal rigidity Abdominal: Soft. Bowel sounds are normal.  exhibits no distension. There is no tenderness.  Lymphadenopathy: no cervical adenopathy. No axillary adenopathy Neurological: alert and oriented to person, place, and time.  Skin: Skin is warm and dry. No rash noted. No erythema.  Psychiatric: a normal mood and affect.  behavior is normal.   Lab Results  Component Value Date   CD4TCELL 26 (L) 07/22/2017   Lab Results  Component Value Date   CD4TABS 280 (L) 07/22/2017   CD4TABS 260 (L) 01/15/2017   CD4TABS 290 (L) 10/02/2016   Lab Results  Component Value Date   HIV1RNAQUANT <20 NOT DETECTED 07/22/2017   Lab Results  Component Value Date   HEPBSAB NEG 10/06/2013   Lab Results  Component Value Date   LABRPR NON REAC 10/02/2016    CBC Lab Results  Component Value Date   WBC 2.7 (L) 07/22/2017   RBC 4.09 07/22/2017   HGB 12.6 07/22/2017   HCT 36.8 07/22/2017   PLT 247 07/22/2017  MCV 90.0 07/22/2017   MCH 30.8 07/22/2017   MCHC 34.2 07/22/2017   RDW 13.0 07/22/2017   LYMPHSABS 1,061 07/22/2017   MONOABS 154 (L) 10/02/2016   EOSABS 221 07/22/2017    BMET Lab Results  Component Value Date   NA 138 07/22/2017   K 4.5 07/22/2017   CL 105 07/22/2017   CO2 25 07/22/2017   GLUCOSE 101 (H) 07/22/2017   BUN 12 07/22/2017   CREATININE 0.65 07/22/2017   CALCIUM 9.1 07/22/2017   GFRNONAA 106 07/22/2017   GFRAA 123 07/22/2017      Assessment and Plan  Epigastric pain = not improved with PPI. Wonder if h.pylori related -  intermediate pain. Not always associated with eating. 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 Will check with pharmacy to see why she didn't get 30d supply. And also [employer] issue with [employer]. Otherwise, well controlled. Continue on current regimen

## 2017-08-14 ENCOUNTER — Other Ambulatory Visit: Payer: Self-pay

## 2017-08-14 ENCOUNTER — Telehealth: Payer: Self-pay

## 2017-08-14 DIAGNOSIS — B20 Human immunodeficiency virus [HIV] disease: Secondary | ICD-10-CM

## 2017-08-14 DIAGNOSIS — G63 Polyneuropathy in diseases classified elsewhere: Principal | ICD-10-CM

## 2017-08-14 MED ORDER — PREGABALIN 50 MG PO CAPS: 50.0000 mg | ORAL_CAPSULE | Freq: Two times a day (BID) | ORAL | 2 refills | Status: DC

## 2017-08-14 NOTE — Telephone Encounter (Signed)
[facility] called today asking for a refill for pt's Lyrica be sent to them for delivery. Pharmacist stated a refill was sent to [facility] in [place] and that we would have to cancel that order to get the pt to have all meds delivered at same time from same pharmacy. Canceled order sent to [facility] and new one was sent to correct [place], [place]

## 2017-08-16 ENCOUNTER — Other Ambulatory Visit: Payer: BLUE CROSS/BLUE SHIELD

## 2017-08-16 DIAGNOSIS — R1013 Epigastric pain: Secondary | ICD-10-CM

## 2017-08-19 LAB — HELICOBACTER PYLORI  SPECIAL ANTIGEN
MICRO NUMBER: 90691104
MICRO NUMBER:: 90686766
SPECIMEN QUALITY: ADEQUATE
SPECIMEN QUALITY: ADEQUATE

## 2017-09-10 ENCOUNTER — Other Ambulatory Visit: Payer: Self-pay

## 2017-09-10 ENCOUNTER — Other Ambulatory Visit: Payer: Self-pay | Admitting: Internal Medicine

## 2017-09-10 DIAGNOSIS — B20 Human immunodeficiency virus [HIV] disease: Secondary | ICD-10-CM

## 2017-09-10 DIAGNOSIS — K219 Gastro-esophageal reflux disease without esophagitis: Secondary | ICD-10-CM

## 2017-09-10 MED ORDER — OMEPRAZOLE 20 MG PO CPDR: 20.0000 mg | DELAYED_RELEASE_CAPSULE | Freq: Every day | ORAL | 5 refills | Status: DC

## 2017-09-16 ENCOUNTER — Other Ambulatory Visit: Payer: Self-pay

## 2017-09-16 DIAGNOSIS — K219 Gastro-esophageal reflux disease without esophagitis: Secondary | ICD-10-CM

## 2017-09-16 MED ORDER — OMEPRAZOLE 20 MG PO CPDR
20.0000 mg | DELAYED_RELEASE_CAPSULE | Freq: Every day | ORAL | 5 refills | Status: DC
Start: 2017-09-16 — End: 2021-03-27

## 2017-09-16 NOTE — Telephone Encounter (Signed)
Request for Bactrim and omeprazole .  Bactrim has been discontinued.   Laverle Patter, RN

## 2017-10-03 ENCOUNTER — Encounter: Payer: Self-pay | Admitting: Internal Medicine

## 2017-10-08 ENCOUNTER — Telehealth: Payer: Self-pay | Admitting: *Deleted

## 2017-10-08 ENCOUNTER — Other Ambulatory Visit: Payer: Self-pay | Admitting: Internal Medicine

## 2017-10-08 DIAGNOSIS — B20 Human immunodeficiency virus [HIV] disease: Secondary | ICD-10-CM

## 2017-10-08 NOTE — Telephone Encounter (Signed)
[facility] called regarding Bactrim DS.  Bactrim DS was discontinued by Dr. Baxter Flattery May 14, 2017 due to the patient's last 3 CD4 counts were above 200.  RN shared reason that Bactrim DS was discontinued with [facility].  [facility] will inform the patient.

## 2017-11-13 ENCOUNTER — Ambulatory Visit: Payer: Self-pay | Admitting: Internal Medicine

## 2017-11-20 ENCOUNTER — Ambulatory Visit (INDEPENDENT_AMBULATORY_CARE_PROVIDER_SITE_OTHER): Payer: BLUE CROSS/BLUE SHIELD | Admitting: Internal Medicine

## 2017-11-20 ENCOUNTER — Encounter: Payer: Self-pay | Admitting: Internal Medicine

## 2017-11-20 VITALS — BP 116/73 | HR 66 | Temp 98.6°F | Wt 168.0 lb

## 2017-11-20 DIAGNOSIS — B20 Human immunodeficiency virus [HIV] disease: Secondary | ICD-10-CM | POA: Diagnosis not present

## 2017-11-20 DIAGNOSIS — Z23 Encounter for immunization: Secondary | ICD-10-CM | POA: Diagnosis not present

## 2017-11-20 DIAGNOSIS — Z79899 Other long term (current) drug therapy: Secondary | ICD-10-CM | POA: Diagnosis not present

## 2017-11-20 NOTE — Patient Instructions (Signed)
   Pap examination dans 2 mos.   [facility]

## 2017-11-20 NOTE — Progress Notes (Signed)
RFV: follow up for hiv disease  Patient ID: Carolyn Williamson, female   DOB: 08/24/1970, 47 y.o.   MRN: [id number]  HPI Carolyn Williamson is 47yo F with hiv disease, CD 4 count 280/VL<20, on genvoya. Doing well with taking her medication. 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 She feels better not as much work on her feet.  Soc hx : not in any new relationships. No smoking. No alcohol use  Outpatient Encounter Medications as of 11/20/2017  Medication Sig  . elvitegravir-cobicistat-emtricitabine-tenofovir (GENVOYA) 150-150-200-10 MG TABS tablet Take 1 tablet by mouth daily with breakfast.  . ketotifen (ALLERGY EYE DROPS) 0.025 % ophthalmic solution Place 1 drop into both eyes 2 (two) times daily.  Marland Kitchen nystatin (MYCOSTATIN/NYSTOP) powder Apply topically 3 (three) times daily. appliquer entre vos orteils trois fois par [employer]  . omeprazole (PRILOSEC) 20 MG capsule Take 1 capsule (20 mg total) by mouth daily.  . pregabalin (LYRICA) 50 MG capsule Take 1 capsule (50 mg total) by mouth 2 (two) times daily.  Marland Kitchen triamcinolone cream (KENALOG) 0.1 % Apply 1 application topically 2 (two) times daily.   No facility-administered encounter medications on file as of 11/20/2017.      Patient Active Problem List   Diagnosis Date Noted  . 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 Bilateral leg edema 02/25/2014  . Abdominal muscle strain 01/29/2014  . Premature menopause 01/06/2014  . Seasonal allergies 01/06/2014  . Low back pain 11/30/2013  . Abdominal pain, epigastric 10/21/2013  . Myopia 10/21/2013  . Dental caries 10/21/2013  . Immigrant with language difficulty 09/14/2013  . Refugee health examination 09/14/2013  . HIV disease ([place]) 09/14/2013  . Acne vulgaris 09/14/2013     Health Maintenance Due  Topic Date Due  . INFLUENZA VACCINE  10/10/2017     Review of Systems Review of Systems  Constitutional: Negative for fever, chills, diaphoresis,  activity change, appetite change, fatigue and unexpected weight change.  HENT: Negative for congestion, sore throat, rhinorrhea, sneezing, trouble swallowing and sinus pressure.  Eyes: Negative for photophobia and visual disturbance.  Respiratory: Negative for cough, chest tightness, shortness of breath, wheezing and stridor.  Cardiovascular: Negative for chest pain, palpitations and leg swelling.  Gastrointestinal: Negative for nausea, vomiting, abdominal pain, diarrhea, constipation, blood in stool, abdominal distention and anal bleeding.  Genitourinary: Negative for dysuria, hematuria, flank pain and difficulty urinating.  Musculoskeletal: Negative for myalgias, back pain, joint swelling, arthralgias and gait problem.  Skin: Negative for color change, pallor, rash and wound.  Neurological: Negative for dizziness, tremors, weakness and light-headedness.  Hematological: Negative for adenopathy. Does not bruise/bleed easily.  Psychiatric/Behavioral: Negative for behavioral problems, confusion, sleep disturbance, dysphoric mood, decreased concentration and agitation.    Physical Exam  BP 116/73   Pulse 66   Temp 98.6 F (37 C) (Oral)   Wt 168 lb (76.2 kg)   LMP 05/09/2013   BMI 28.84 kg/m  Physical Exam  Constitutional:  oriented to person, place, and time. appears well-developed and well-nourished. No distress.  HENT: [place]/AT, PERRLA, no scleral icterus Mouth/Throat: Oropharynx is clear and moist. No oropharyngeal exudate.  Cardiovascular: Normal rate, regular rhythm and normal heart sounds. Exam reveals no gallop and no friction rub.  No murmur heard.  Pulmonary/Chest: Effort normal and breath sounds normal. No respiratory distress.  has no wheezes.  Neck = supple, no nuchal rigidity Abdominal: Soft. Bowel sounds are normal.  exhibits no distension. There is  no tenderness.  Lymphadenopathy: no cervical adenopathy. No axillary adenopathy Neurological: alert and oriented to person,  place, and time.  Skin: Skin is warm and dry. No rash noted. No erythema.  Psychiatric: a normal mood and affect.  behavior is normal.    Lab Results  Component Value Date   CD4TCELL 26 (L) 07/22/2017   Lab Results  Component Value Date   CD4TABS 280 (L) 07/22/2017   CD4TABS 260 (L) 01/15/2017   CD4TABS 290 (L) 10/02/2016   Lab Results  Component Value Date   HIV1RNAQUANT <20 NOT DETECTED 07/22/2017   Lab Results  Component Value Date   HEPBSAB NEG 10/06/2013   Lab Results  Component Value Date   LABRPR NON REAC 10/02/2016    CBC Lab Results  Component Value Date   WBC 2.7 (L) 07/22/2017   RBC 4.09 07/22/2017   HGB 12.6 07/22/2017   HCT 36.8 07/22/2017   PLT 247 07/22/2017   MCV 90.0 07/22/2017   MCH 30.8 07/22/2017   MCHC 34.2 07/22/2017   RDW 13.0 07/22/2017   LYMPHSABS 1,061 07/22/2017   MONOABS 154 (L) 10/02/2016   EOSABS 221 07/22/2017    BMET Lab Results  Component Value Date   NA 138 07/22/2017   K 4.5 07/22/2017   CL 105 07/22/2017   CO2 25 07/22/2017   GLUCOSE 101 (H) 07/22/2017   BUN 12 07/22/2017   CREATININE 0.65 07/22/2017   CALCIUM 9.1 07/22/2017   GFRNONAA 106 07/22/2017   GFRAA 123 07/22/2017      Assessment and Plan  Health maintenance =Needs mammo and pap; Getting flu vaccine  hiv disease = will Refill genvoya; Labs in 2 months  Long term medication management = cr is stable

## 2017-12-13 ENCOUNTER — Telehealth (HOSPITAL_COMMUNITY): Payer: Self-pay

## 2017-12-13 NOTE — Telephone Encounter (Signed)
Called left a message to call [facility] °

## 2017-12-20 ENCOUNTER — Telehealth (HOSPITAL_COMMUNITY): Payer: Self-pay

## 2017-12-20 NOTE — Telephone Encounter (Signed)
Called patient said she has insurance

## 2018-01-20 ENCOUNTER — Other Ambulatory Visit (HOSPITAL_COMMUNITY)
Admission: RE | Admit: 2018-01-20 | Discharge: 2018-01-20 | Disposition: A | Discharge status: Home / Self Care | Payer: BLUE CROSS/BLUE SHIELD | Source: Ambulatory Visit | Attending: Infectious Diseases | Admitting: Infectious Diseases

## 2018-01-20 ENCOUNTER — Encounter: Payer: Self-pay | Admitting: Infectious Diseases

## 2018-01-20 ENCOUNTER — Ambulatory Visit (INDEPENDENT_AMBULATORY_CARE_PROVIDER_SITE_OTHER): Payer: BLUE CROSS/BLUE SHIELD | Admitting: Infectious Diseases

## 2018-01-20 VITALS — BP 112/74 | HR 64 | Temp 98.3°F | Wt 162.0 lb

## 2018-01-20 DIAGNOSIS — Z124 Encounter for screening for malignant neoplasm of cervix: Secondary | ICD-10-CM

## 2018-01-20 DIAGNOSIS — Z01419 Encounter for gynecological examination (general) (routine) without abnormal findings: Secondary | ICD-10-CM | POA: Diagnosis not present

## 2018-01-20 DIAGNOSIS — B20 Human immunodeficiency virus [HIV] disease: Secondary | ICD-10-CM | POA: Diagnosis not present

## 2018-01-20 NOTE — Patient Instructions (Signed)
Please call the [facility] to set up your mammogram.  [phone] [address], [place], [place] [address]

## 2018-01-20 NOTE — Progress Notes (Signed)
      Subjective:    Carolyn Williamson is a 47 y.o. female here for an annual pelvic exam and pap smear.   Review of Systems: Current GYN complaints or concerns: fear over exam today. Finds them to be very painful. No discharge or dysuria. She is not sexually active and has not been for many years.   Past Medical History:  Diagnosis Date  . Fibroids   . HIV (human immunodeficiency virus infection) [facility])     Gynecologic History: No obstetric history on file.  Patient's last menstrual period was 05/09/2013. Contraception: abstinence Last Pap: 08-2015. Results were: normal Anal Intercourse: no Last Mammogram: Ordered - no study on file.   Objective:  Physical Exam  Constitutional: Well developed, well nourished, no acute distress. She is alert and oriented x3.  Pelvic: External genitalia is normal in appearance. The vagina is normal in appearance. The cervix is bulbous, erythematous and easily visualized. Normal expected cervical mucus present.Severe discomfort/pain throughout speculum exam; declined internal. Small speculum used to facilitate comfort during exam - vaginal walls appear normal.  Breasts: symmetrical in contour, shape and texture. No palpable masses/nodules. No nipple discharge.  Psych: She has a normal mood and affect.    Assessment:  Normal pelvic/speculum exam. Thin prep pap was obtained and sent for cytology with reflex HPV and GC/C today.  Plan:  Health Maintenance =   Return in 3 years for annual pap screening unless indicated sooner from today's exam. Discussed recommended screening interval for women living with HIV disease.   She has been counseled and instructed how to perform monthly self breast exams.  Screening mammogram to be scheduled - will give phone number to schedule with GI center considering she has not yet had an appointment scheduled.   Contraception / Family Planning =   Not currently sexually active   HIV =   She will continue her  Genvoya and F/U as scheduled with Dr. Baxter Flattery for ongoing HIV care.   Janene Madeira, MSN, NP-C [facility] [place] Office: [phone] Pager: [phone]  01/20/18 12:16 PM

## 2018-01-23 ENCOUNTER — Encounter: Payer: Self-pay | Admitting: Infectious Diseases

## 2018-01-23 LAB — CYTOLOGY - PAP
Chlamydia: NEGATIVE
Diagnosis: NEGATIVE
HPV: NOT DETECTED
Neisseria Gonorrhea: NEGATIVE

## 2018-01-23 NOTE — Progress Notes (Signed)
Pap smear normal. Repeat test in 3 years with 2 previous consecutive normal tests.

## 2018-02-05 ENCOUNTER — Other Ambulatory Visit: Payer: Self-pay | Admitting: Internal Medicine

## 2018-02-05 DIAGNOSIS — B20 Human immunodeficiency virus [HIV] disease: Secondary | ICD-10-CM

## 2018-02-19 ENCOUNTER — Ambulatory Visit (INDEPENDENT_AMBULATORY_CARE_PROVIDER_SITE_OTHER): Payer: BLUE CROSS/BLUE SHIELD | Admitting: Internal Medicine

## 2018-02-19 VITALS — BP 108/68 | HR 67 | Temp 97.6°F | Wt 164.0 lb

## 2018-02-19 DIAGNOSIS — R21 Rash and other nonspecific skin eruption: Secondary | ICD-10-CM

## 2018-02-19 DIAGNOSIS — F5101 Primary insomnia: Secondary | ICD-10-CM | POA: Diagnosis not present

## 2018-02-19 DIAGNOSIS — B20 Human immunodeficiency virus [HIV] disease: Secondary | ICD-10-CM

## 2018-02-19 MED ORDER — HYDROCORTISONE 1 % EX OINT: 1.0000 "application " | TOPICAL_OINTMENT | Freq: Two times a day (BID) | CUTANEOUS | 0 refills | Status: DC

## 2018-02-19 NOTE — Progress Notes (Signed)
RFV: follow up for hiv disease  Patient ID: Carolyn Williamson, female   DOB: November 21, 1970, 47 y.o.   MRN: [id number]  HPI  Carolyn Williamson is a 47yo F from [place], french speaking. HIV disease, well controlled on genvoya, CD 4 count of 280/VL<20 (may 2019) has excellent adherence, good health except Irregular sleep since she works 3rd shift . Gets home at 5 am sleeps from 6 til noon mostly Had pap in nov Outpatient Encounter Medications as of 02/19/2018  Medication Sig  . GENVOYA 150-150-200-10 MG TABS tablet TAKE 1 TABLET BY MOUTH EVERY DAY WITH BREAKFAST  . ketotifen (ALLERGY EYE DROPS) 0.025 % ophthalmic solution Place 1 drop into both eyes 2 (two) times daily.  Marland Kitchen nystatin (MYCOSTATIN/NYSTOP) powder Apply topically 3 (three) times daily. appliquer entre vos orteils trois fois par [employer]  . omeprazole (PRILOSEC) 20 MG capsule Take 1 capsule (20 mg total) by mouth daily.  . pregabalin (LYRICA) 50 MG capsule Take 1 capsule (50 mg total) by mouth 2 (two) times daily.  . [DISCONTINUED] triamcinolone cream (KENALOG) 0.1 % Apply 1 application topically 2 (two) times daily. (Patient not taking: Reported on 02/19/2018)   No facility-administered encounter medications on file as of 02/19/2018.      Patient Active Problem List   Diagnosis Date Noted  . 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 Bilateral leg edema 02/25/2014  . Abdominal muscle strain 01/29/2014  . Premature menopause 01/06/2014  . Seasonal allergies 01/06/2014  . Low back pain 11/30/2013  . Abdominal pain, epigastric 10/21/2013  . Myopia 10/21/2013  . Dental caries 10/21/2013  . Immigrant with language difficulty 09/14/2013  . Refugee health examination 09/14/2013  . HIV disease ([place]) 09/14/2013  . Acne vulgaris 09/14/2013     Health Maintenance Due  Topic Date Due  . MAMMOGRAM  08/03/1988    Social History   Tobacco Use  . Smoking status: Never Smoker  . Smokeless tobacco: Never Used  Substance Use Topics  .  Alcohol use: No    Alcohol/week: 0.0 standard drinks  . Drug use: No   Review of Systems Review of Systems  Constitutional: Negative for fever, chills, diaphoresis, activity change, appetite change, fatigue and unexpected weight change.  HENT: Negative for congestion, sore throat, rhinorrhea, sneezing, trouble swallowing and sinus pressure.  Eyes: Negative for photophobia and visual disturbance.  Respiratory: Negative for cough, chest tightness, shortness of breath, wheezing and stridor.  Cardiovascular: Negative for chest pain, palpitations and leg swelling.  Gastrointestinal: Negative for nausea, vomiting, abdominal pain, diarrhea, constipation, blood in stool, abdominal distention and anal bleeding.  Genitourinary: Negative for dysuria, hematuria, flank pain and difficulty urinating.  Musculoskeletal: Negative for myalgias, back pain, joint swelling, arthralgias and gait problem.  Skin: Negative for color change, pallor, rash and wound.  Neurological: Negative for dizziness, tremors, weakness and light-headedness.  Hematological: Negative for adenopathy. Does not bruise/bleed easily.  Psychiatric/Behavioral: insomnia   Physical Exam   BP 108/68   Pulse 67   Temp 97.6 F (36.4 C) (Oral)   Wt 164 lb (74.4 kg)   LMP 05/09/2013   BMI 28.15 kg/m   Physical Exam  Constitutional:  oriented to person, place, and time. appears well-developed and well-nourished. No distress.  HENT: [place]/AT, PERRLA, no scleral icterus Mouth/Throat: Oropharynx is clear and moist. No oropharyngeal exudate.  Cardiovascular: Normal rate, regular rhythm and normal heart sounds. Exam reveals no gallop and no friction rub.  No murmur heard.  Pulmonary/Chest: Effort normal and breath sounds normal.  No respiratory distress.  has no wheezes.  Neck = supple, no nuchal rigidity Abdominal: Soft. Bowel sounds are normal.  exhibits no distension. There is no tenderness.  Lymphadenopathy: no cervical adenopathy. No  axillary adenopathy Neurological: alert and oriented to person, place, and time.  Skin: Skin is warm and dry. No rash noted. No erythema.  Psychiatric: a normal mood and affect.  behavior is normal.   Lab Results  Component Value Date   CD4TCELL 26 (L) 07/22/2017   Lab Results  Component Value Date   CD4TABS 280 (L) 07/22/2017   CD4TABS 260 (L) 01/15/2017   CD4TABS 290 (L) 10/02/2016   Lab Results  Component Value Date   HIV1RNAQUANT <20 NOT DETECTED 07/22/2017   Lab Results  Component Value Date   HEPBSAB NEG 10/06/2013   Lab Results  Component Value Date   LABRPR NON REAC 10/02/2016    CBC Lab Results  Component Value Date   WBC 2.7 (L) 07/22/2017   RBC 4.09 07/22/2017   HGB 12.6 07/22/2017   HCT 36.8 07/22/2017   PLT 247 07/22/2017   MCV 90.0 07/22/2017   MCH 30.8 07/22/2017   MCHC 34.2 07/22/2017   RDW 13.0 07/22/2017   LYMPHSABS 1,061 07/22/2017   MONOABS 154 (L) 10/02/2016   EOSABS 221 07/22/2017    BMET Lab Results  Component Value Date   NA 138 07/22/2017   K 4.5 07/22/2017   CL 105 07/22/2017   CO2 25 07/22/2017   GLUCOSE 101 (H) 07/22/2017   BUN 12 07/22/2017   CREATININE 0.65 07/22/2017   CALCIUM 9.1 07/22/2017   GFRNONAA 106 07/22/2017   GFRAA 123 07/22/2017      Assessment and Plan  hiv disease= labs today.continue with genvoya  Health maintenance = vaccine up todate  Insomnia= discussed importance of sleep hygiene and attempt to get 6-8 hr per night  Women's health = getting mammo tomorrow. Needs direction  Rash = has scattered maculopapular rash that appears consistent with contact dermatitis.will refill triamcinolone cream  rtc in 4 mo

## 2018-02-20 ENCOUNTER — Ambulatory Visit
Admission: RE | Admit: 2018-02-20 | Discharge: 2018-02-20 | Disposition: A | Discharge status: Home / Self Care | Payer: BLUE CROSS/BLUE SHIELD | Source: Ambulatory Visit | Attending: Internal Medicine | Admitting: Internal Medicine

## 2018-02-20 DIAGNOSIS — B20 Human immunodeficiency virus [HIV] disease: Secondary | ICD-10-CM

## 2018-02-20 LAB — T-HELPER CELL (CD4) - (RCID CLINIC ONLY)
CD4 % Helper T Cell: 34 % (ref 33–55)
CD4 T Cell Abs: 380 /uL — ABNORMAL LOW (ref 400–2700)

## 2018-02-21 LAB — COMPLETE METABOLIC PANEL WITH GFR
AG Ratio: 1 (calc) (ref 1.0–2.5)
ALT: 13 U/L (ref 6–29)
AST: 19 U/L (ref 10–35)
Albumin: 3.8 g/dL (ref 3.6–5.1)
Alkaline phosphatase (APISO): 88 U/L (ref 33–115)
BUN: 9 mg/dL (ref 7–25)
CO2: 27 mmol/L (ref 20–32)
Calcium: 8.9 mg/dL (ref 8.6–10.2)
Chloride: 105 mmol/L (ref 98–110)
Creat: 0.67 mg/dL (ref 0.50–1.10)
GFR, Est African American: 121 mL/min/{1.73_m2} (ref >=60)
GFR, Est Non African American: 105 mL/min/{1.73_m2} (ref >=60)
Globulin: 3.7 g/dL (calc) (ref 1.9–3.7)
Glucose, Bld: 95 mg/dL (ref 65–99)
POTASSIUM: 4.4 mmol/L (ref 3.5–5.3)
Sodium: 138 mmol/L (ref 135–146)
Total Bilirubin: 0.4 mg/dL (ref 0.2–1.2)
Total Protein: 7.5 g/dL (ref 6.1–8.1)

## 2018-02-21 LAB — CBC WITH DIFFERENTIAL/PLATELET
Basophils Absolute: 21 cells/uL (ref 0–200)
Basophils Relative: 0.9 %
Eosinophils Absolute: 173 cells/uL (ref 15–500)
Eosinophils Relative: 7.5 %
HCT: 37 % (ref 35.0–45.0)
Hemoglobin: 12.6 g/dL (ref 11.7–15.5)
Lymphs Abs: 1191 cells/uL (ref 850–3900)
MCH: 31.1 pg (ref 27.0–33.0)
MCHC: 34.1 g/dL (ref 32.0–36.0)
MCV: 91.4 fL (ref 80.0–100.0)
MPV: 10.2 fL (ref 7.5–12.5)
Monocytes Relative: 11 %
Neutro Abs: 662 cells/uL — ABNORMAL LOW (ref 1500–7800)
Neutrophils Relative %: 28.8 %
Platelets: 264 10*3/uL (ref 140–400)
RBC: 4.05 10*6/uL (ref 3.80–5.10)
RDW: 13.3 % (ref 11.0–15.0)
TOTAL LYMPHOCYTE: 51.8 %
WBC mixed population: 253 cells/uL (ref 200–950)
WBC: 2.3 10*3/uL — ABNORMAL LOW (ref 3.8–10.8)

## 2018-02-21 LAB — HIV-1 RNA QUANT-NO REFLEX-BLD
HIV 1 RNA Quant: 61 copies/mL — ABNORMAL HIGH
HIV-1 RNA Quant, Log: 1.79 Log copies/mL — ABNORMAL HIGH

## 2018-02-21 LAB — RPR: RPR Ser Ql: NONREACTIVE

## 2018-04-15 ENCOUNTER — Encounter: Payer: Self-pay | Admitting: Internal Medicine

## 2018-06-03 ENCOUNTER — Other Ambulatory Visit: Payer: Self-pay | Admitting: Internal Medicine

## 2018-06-03 DIAGNOSIS — B20 Human immunodeficiency virus [HIV] disease: Secondary | ICD-10-CM

## 2018-06-09 ENCOUNTER — Ambulatory Visit: Payer: Self-pay

## 2018-06-09 ENCOUNTER — Other Ambulatory Visit: Payer: Self-pay

## 2018-06-24 ENCOUNTER — Ambulatory Visit: Payer: Self-pay | Admitting: Family

## 2018-06-30 ENCOUNTER — Telehealth: Payer: Self-pay

## 2018-06-30 ENCOUNTER — Other Ambulatory Visit: Payer: Self-pay

## 2018-06-30 DIAGNOSIS — R197 Diarrhea, unspecified: Secondary | ICD-10-CM

## 2018-06-30 MED ORDER — LOPERAMIDE HCL 2 MG PO CAPS: 2.0000 mg | ORAL_CAPSULE | ORAL | Status: DC | PRN

## 2018-06-30 MED ORDER — LOPERAMIDE HCL 2 MG PO TABS: 2.0000 mg | ORAL_TABLET | Freq: Four times a day (QID) | ORAL | 0 refills | Status: DC | PRN

## 2018-06-30 NOTE — Telephone Encounter (Signed)
Patient walked into clinic for financial assistance and requested to see a nurse.   She has complaint of diarrhea loose stools and no appetite for one week.   Her vital signs were stable:  BP: 103/69  P: 94  Tempt: oral 98.9    Patient advised to try Imodium AD which was sent to pharmacy since she has UMAP.   Encouraged to drink fluids for hydration and call primary care provider if symptoms worsen.  Advised to try broth since she has no appetite.    Laverle Patter, RN

## 2018-07-08 ENCOUNTER — Other Ambulatory Visit: Payer: Self-pay | Admitting: Internal Medicine

## 2018-07-08 DIAGNOSIS — B20 Human immunodeficiency virus [HIV] disease: Secondary | ICD-10-CM

## 2018-09-15 ENCOUNTER — Ambulatory Visit (INDEPENDENT_AMBULATORY_CARE_PROVIDER_SITE_OTHER): Payer: Self-pay | Admitting: Internal Medicine

## 2018-09-15 ENCOUNTER — Encounter: Payer: Self-pay | Admitting: Internal Medicine

## 2018-09-15 ENCOUNTER — Other Ambulatory Visit: Payer: Self-pay

## 2018-09-15 VITALS — BP 115/75 | HR 58 | Temp 97.5°F | Wt 164.0 lb

## 2018-09-15 DIAGNOSIS — R600 Localized edema: Secondary | ICD-10-CM

## 2018-09-15 DIAGNOSIS — B20 Human immunodeficiency virus [HIV] disease: Secondary | ICD-10-CM

## 2018-09-15 DIAGNOSIS — Z79899 Other long term (current) drug therapy: Secondary | ICD-10-CM

## 2018-09-15 MED ORDER — GENVOYA 150-150-200-10 MG PO TABS: 1.0000 | ORAL_TABLET | Freq: Every day | ORAL | 11 refills | Status: DC

## 2018-09-15 NOTE — Progress Notes (Signed)
RFV: follow up for hiv disease  Patient ID: Carolyn Williamson, female   DOB: 1970/10/07, 48 y.o.   MRN: [id number]  HPI  Carolyn Williamson is a 48yo F french speaking. With well controlled hiv disease. She reports that she has good adherence with her medication. Though she spends much time on her feet that she denies any knee pain today.  She has noticed having new onset Varicose veins Outpatient Encounter Medications as of 09/15/2018  Medication Sig  . GENVOYA 150-150-200-10 MG TABS tablet TAKE 1 TABLET BY MOUTH EVERY DAY WITH BREAKFAST  . hydrocortisone 1 % ointment Apply 1 application topically 2 (two) times daily.  Marland Kitchen ketotifen (ALLERGY EYE DROPS) 0.025 % ophthalmic solution Place 1 drop into both eyes 2 (two) times daily. (Patient not taking: Reported on 09/15/2018)  . nystatin (MYCOSTATIN/NYSTOP) powder Apply topically 3 (three) times daily. appliquer entre vos orteils trois fois par jour (Patient not taking: Reported on 09/15/2018)  . omeprazole (PRILOSEC) 20 MG capsule Take 1 capsule (20 mg total) by mouth daily. (Patient not taking: Reported on 09/15/2018)  . pregabalin (LYRICA) 50 MG capsule Take 1 capsule (50 mg total) by mouth 2 (two) times daily. (Patient not taking: Reported on 09/15/2018)   Facility-Administered Encounter Medications as of 09/15/2018  Medication  . loperamide (IMODIUM) capsule 2 mg     Patient Active Problem List   Diagnosis Date Noted  . 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 Bilateral leg edema 02/25/2014  . Abdominal muscle strain 01/29/2014  . Premature menopause 01/06/2014  . Seasonal allergies 01/06/2014  . Low back pain 11/30/2013  . Abdominal pain, epigastric 10/21/2013  . Myopia 10/21/2013  . Dental caries 10/21/2013  . Immigrant with language difficulty 09/14/2013  . Refugee health examination 09/14/2013  . HIV disease ([place]) 09/14/2013  . Acne vulgaris 09/14/2013   Social History   Tobacco Use  . Smoking status: Never Smoker  . Smokeless tobacco: Never  Used  Substance Use Topics  . Alcohol use: No    Alcohol/week: 0.0 standard drinks  . Drug use: No    There are no preventive care reminders to display for this patient.   Review of Systems 12 point ros is negative except what is mentioned above Physical Exam   BP 115/75   Pulse (!) 58   Temp (!) 97.5 F (36.4 C) (Oral)   Wt 164 lb (74.4 kg)   LMP 05/09/2013   BMI 28.15 kg/m   Physical Exam  Constitutional:  oriented to person, place, and time. appears well-developed and well-nourished. No distress.  HENT: [place]/AT, PERRLA, no scleral icterus Mouth/Throat: Oropharynx is clear and moist. No oropharyngeal exudate.  Cardiovascular: Normal rate, regular rhythm and normal heart sounds. Exam reveals no gallop and no friction rub.  No murmur heard.  Pulmonary/Chest: Effort normal and breath sounds normal. No respiratory distress.  has no wheezes.  Neck = supple, no nuchal rigidity Abdominal: Soft. Bowel sounds are normal.  exhibits no distension. There is no tenderness.  Lymphadenopathy: no cervical adenopathy. No axillary adenopathy Neurological: alert and oriented to person, place, and time.  Skin: Skin is warm and dry. No rash noted. No erythema.  Psychiatric: a normal mood and affect.  behavior is normal.   Lab Results  Component Value Date   CD4TCELL 34 02/19/2018   Lab Results  Component Value Date   CD4TABS 380 (L) 02/19/2018   CD4TABS 280 (L) 07/22/2017   CD4TABS 260 (L) 01/15/2017   Lab Results  Component Value Date  HIV1RNAQUANT 61 (H) 02/19/2018   Lab Results  Component Value Date   HEPBSAB NEG 10/06/2013   Lab Results  Component Value Date   LABRPR NON-REACTIVE 02/19/2018    CBC Lab Results  Component Value Date   WBC 2.3 (L) 02/19/2018   RBC 4.05 02/19/2018   HGB 12.6 02/19/2018   HCT 37.0 02/19/2018   PLT 264 02/19/2018   MCV 91.4 02/19/2018   MCH 31.1 02/19/2018   MCHC 34.1 02/19/2018   RDW 13.3 02/19/2018   LYMPHSABS 1,191 02/19/2018    MONOABS 154 (L) 10/02/2016   EOSABS 173 02/19/2018    BMET Lab Results  Component Value Date   NA 138 02/19/2018   K 4.4 02/19/2018   CL 105 02/19/2018   CO2 27 02/19/2018   GLUCOSE 95 02/19/2018   BUN 9 02/19/2018   CREATININE 0.67 02/19/2018   CALCIUM 8.9 02/19/2018   GFRNONAA 105 02/19/2018   GFRAA 121 02/19/2018      Assessment and Plan  hiv labs = will check hiv labs and refills on meds  Long term medication management = cr is stable, but will check cr to ensure still at her baseline  Trace pedal edema after standing on feet= will recommend Compression stockings

## 2018-09-16 LAB — T-HELPER CELL (CD4) - (RCID CLINIC ONLY)
CD4 % Helper T Cell: 30 % — ABNORMAL LOW (ref 33–65)
CD4 T Cell Abs: 288 /uL — ABNORMAL LOW (ref 400–1790)

## 2018-09-23 ENCOUNTER — Telehealth: Payer: Self-pay | Admitting: Pharmacy Technician

## 2018-09-23 ENCOUNTER — Encounter: Payer: Self-pay | Admitting: Internal Medicine

## 2018-09-23 LAB — COMPLETE METABOLIC PANEL WITH GFR
AG Ratio: 1.1 (calc) (ref 1.0–2.5)
ALT: 12 U/L (ref 6–29)
AST: 19 U/L (ref 10–35)
Albumin: 3.9 g/dL (ref 3.6–5.1)
Alkaline phosphatase (APISO): 92 U/L (ref 31–125)
BUN: 9 mg/dL (ref 7–25)
CO2: 27 mmol/L (ref 20–32)
Calcium: 9 mg/dL (ref 8.6–10.2)
Chloride: 106 mmol/L (ref 98–110)
Creat: 0.65 mg/dL (ref 0.50–1.10)
GFR, Est African American: 122 mL/min/{1.73_m2} (ref >=60)
GFR, Est Non African American: 105 mL/min/{1.73_m2} (ref >=60)
Globulin: 3.7 g/dL (calc) (ref 1.9–3.7)
Glucose, Bld: 98 mg/dL (ref 65–99)
Potassium: 4.5 mmol/L (ref 3.5–5.3)
Sodium: 138 mmol/L (ref 135–146)
Total Bilirubin: 0.4 mg/dL (ref 0.2–1.2)
Total Protein: 7.6 g/dL (ref 6.1–8.1)

## 2018-09-23 LAB — CBC WITH DIFFERENTIAL/PLATELET
Absolute Monocytes: 406 cells/uL (ref 200–950)
Basophils Absolute: 9 cells/uL (ref 0–200)
Basophils Relative: 0.3 %
Eosinophils Absolute: 496 cells/uL (ref 15–500)
Eosinophils Relative: 17.1 %
HCT: 37.3 % (ref 35.0–45.0)
Hemoglobin: 12.6 g/dL (ref 11.7–15.5)
Lymphs Abs: 954 cells/uL (ref 850–3900)
MCH: 31.3 pg (ref 27.0–33.0)
MCHC: 33.8 g/dL (ref 32.0–36.0)
MCV: 92.6 fL (ref 80.0–100.0)
MPV: 10.1 fL (ref 7.5–12.5)
Monocytes Relative: 14 %
Neutro Abs: 1035 cells/uL — ABNORMAL LOW (ref 1500–7800)
Neutrophils Relative %: 35.7 %
Platelets: 241 10*3/uL (ref 140–400)
RBC: 4.03 10*6/uL (ref 3.80–5.10)
RDW: 14.1 % (ref 11.0–15.0)
Total Lymphocyte: 32.9 %
WBC: 2.9 10*3/uL — ABNORMAL LOW (ref 3.8–10.8)

## 2018-09-23 LAB — HIV-1 RNA QUANT-NO REFLEX-BLD
HIV 1 RNA Quant: <20 copies/mL
HIV-1 RNA Quant, Log: <1.3 Log copies/mL

## 2018-09-23 LAB — RPR: RPR Ser Ql: NONREACTIVE

## 2018-09-23 NOTE — Telephone Encounter (Addendum)
[facility] Patient Advocate Encounter  Completed and sent [employer] Advancing Access application for Gevoya for this patient who is uninsured.    Patient is approved 09/23/2018 to 03/12/2019.  She is over income for HMAP.  BIN      [id number] PCN    [id number] GRP    [id number] ID        [id number]  I called [facility] and gave them the card information.  She has a fill from 09/15/2018 to pick up from her local [facility] where it was shipped.   Venida Jarvis. Nadara Mustard [place] Patient [facility] for Infectious Disease Phone: [phone] Fax:  [phone]

## 2018-12-15 ENCOUNTER — Ambulatory Visit: Payer: Self-pay | Admitting: Internal Medicine

## 2019-02-23 ENCOUNTER — Other Ambulatory Visit: Payer: Self-pay

## 2019-02-23 ENCOUNTER — Ambulatory Visit (INDEPENDENT_AMBULATORY_CARE_PROVIDER_SITE_OTHER): Payer: Self-pay | Admitting: Internal Medicine

## 2019-02-23 VITALS — BP 125/76 | HR 64 | Temp 98.0°F | Wt 173.2 lb

## 2019-02-23 DIAGNOSIS — Z79899 Other long term (current) drug therapy: Secondary | ICD-10-CM

## 2019-02-23 DIAGNOSIS — Z0000 Encounter for general adult medical examination without abnormal findings: Secondary | ICD-10-CM

## 2019-02-23 DIAGNOSIS — B20 Human immunodeficiency virus [HIV] disease: Secondary | ICD-10-CM

## 2019-02-23 DIAGNOSIS — Z23 Encounter for immunization: Secondary | ICD-10-CM

## 2019-02-23 LAB — CBC WITH DIFFERENTIAL/PLATELET
Hemoglobin: 12.8 g/dL (ref 11.7–15.5)
WBC: 2.4 10*3/uL — ABNORMAL LOW (ref 3.8–10.8)

## 2019-02-23 NOTE — Progress Notes (Signed)
RFV: follow up for hiv disease  Patient ID: Carolyn Williamson, female   DOB: 09-11-1970, 48 y.o.   MRN: [id number]  HPI Carolyn Williamson is a 47yo F with hiv disease, well controlled, on genvoya.she reports doing well with work. She continues to wear masks and socially distance. Though has had some coworkers who have been positive. She herself has not had covid-19.   Outpatient Encounter Medications as of 02/23/2019  Medication Sig  . elvitegravir-cobicistat-emtricitabine-tenofovir (GENVOYA) 150-150-200-10 MG TABS tablet Take 1 tablet by mouth daily with breakfast.  . hydrocortisone 1 % ointment Apply 1 application topically 2 (two) times daily. (Patient not taking: Reported on 02/23/2019)  . ketotifen (ALLERGY EYE DROPS) 0.025 % ophthalmic solution Place 1 drop into both eyes 2 (two) times daily. (Patient not taking: Reported on 09/15/2018)  . nystatin (MYCOSTATIN/NYSTOP) powder Apply topically 3 (three) times daily. appliquer entre vos orteils trois fois par jour (Patient not taking: Reported on 09/15/2018)  . omeprazole (PRILOSEC) 20 MG capsule Take 1 capsule (20 mg total) by mouth daily. (Patient not taking: Reported on 09/15/2018)  . pregabalin (LYRICA) 50 MG capsule Take 1 capsule (50 mg total) by mouth 2 (two) times daily. (Patient not taking: Reported on 09/15/2018)   Facility-Administered Encounter Medications as of 02/23/2019  Medication  . loperamide (IMODIUM) capsule 2 mg     Patient Active Problem List   Diagnosis Date Noted  . 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 Bilateral leg edema 02/25/2014  . Abdominal muscle strain 01/29/2014  . Premature menopause 01/06/2014  . Seasonal allergies 01/06/2014  . Low back pain 11/30/2013  . Abdominal pain, epigastric 10/21/2013  . Myopia 10/21/2013  . Dental caries 10/21/2013  . Immigrant with language difficulty 09/14/2013  . Refugee health examination 09/14/2013  . HIV disease ([place]) 09/14/2013  . Acne vulgaris 09/14/2013     Health  Maintenance Due  Topic Date Due  . INFLUENZA VACCINE  10/11/2018  . MAMMOGRAM  02/21/2019    Social History   Tobacco Use  . Smoking status: Never Smoker  . Smokeless tobacco: Never Used  Substance Use Topics  . Alcohol use: No    Alcohol/week: 0.0 standard drinks  . Drug use: No   Review of Systems Review of Systems  Constitutional: Negative for fever, chills, diaphoresis, activity change, appetite change, fatigue and unexpected weight change.  HENT: Negative for congestion, sore throat, rhinorrhea, sneezing, trouble swallowing and sinus pressure.  Eyes: Negative for photophobia and visual disturbance.  Respiratory: Negative for cough, chest tightness, shortness of breath, wheezing and stridor.  Cardiovascular: Negative for chest pain, palpitations and leg swelling.  Gastrointestinal: Negative for nausea, vomiting, abdominal pain, diarrhea, constipation, blood in stool, abdominal distention and anal bleeding.  Genitourinary: Negative for dysuria, hematuria, flank pain and difficulty urinating.  Musculoskeletal: Negative for myalgias, back pain, joint swelling, arthralgias and gait problem.  Skin: Negative for color change, pallor, rash and wound.  Neurological: Negative for dizziness, tremors, weakness and light-headedness.  Hematological: Negative for adenopathy. Does not bruise/bleed easily.  Psychiatric/Behavioral: Negative for behavioral problems, confusion, sleep disturbance, dysphoric mood, decreased concentration and agitation.    Physical Exam   BP 125/76   Pulse 64   Temp 98 F (36.7 C) (Oral)   Wt 173 lb 3.2 oz (78.6 kg)   LMP 05/09/2013   BMI 29.73 kg/m   Physical Exam  Constitutional:  oriented to person, place, and time. appears well-developed and well-nourished. No distress.  HENT: [place]/AT, PERRLA, no scleral icterus Mouth/Throat: Oropharynx  is clear and moist. No oropharyngeal exudate.  Cardiovascular: Normal rate, regular rhythm and normal heart sounds.  Exam reveals no gallop and no friction rub.  No murmur heard.  Pulmonary/Chest: Effort normal and breath sounds normal. No respiratory distress.  has no wheezes.  Neck = supple, no nuchal rigidity Lymphadenopathy: no cervical adenopathy. No axillary adenopathy Neurological: alert and oriented to person, place, and time.  Skin: Skin is warm and dry. No rash noted. No erythema.  Psychiatric: a normal mood and affect.  behavior is normal.   Lab Results  Component Value Date   CD4TCELL 30 (L) 09/15/2018   Lab Results  Component Value Date   CD4TABS 288 (L) 09/15/2018   CD4TABS 380 (L) 02/19/2018   CD4TABS 280 (L) 07/22/2017   Lab Results  Component Value Date   HIV1RNAQUANT <20 NOT DETECTED 09/15/2018   Lab Results  Component Value Date   HEPBSAB NEG 10/06/2013   Lab Results  Component Value Date   LABRPR NON-REACTIVE 09/15/2018    CBC Lab Results  Component Value Date   WBC 2.9 (L) 09/15/2018   RBC 4.03 09/15/2018   HGB 12.6 09/15/2018   HCT 37.3 09/15/2018   PLT 241 09/15/2018   MCV 92.6 09/15/2018   MCH 31.3 09/15/2018   MCHC 33.8 09/15/2018   RDW 14.1 09/15/2018   LYMPHSABS 954 09/15/2018   MONOABS 154 (L) 10/02/2016   EOSABS 496 09/15/2018    BMET Lab Results  Component Value Date   NA 138 09/15/2018   K 4.5 09/15/2018   CL 106 09/15/2018   CO2 27 09/15/2018   GLUCOSE 98 09/15/2018   BUN 9 09/15/2018   CREATININE 0.65 09/15/2018   CALCIUM 9.0 09/15/2018   GFRNONAA 105 09/15/2018   GFRAA 122 09/15/2018      Assessment and Plan  hiv disease = we will plan to refill genvoya. Recommend that we get hiv labs to ensure still well controlled  Long term medication management = will also check cr and ua   Health maintenance = flu shot today, get mammo due this month.   rtc in 3 months

## 2019-02-24 LAB — T-HELPER CELL (CD4) - (RCID CLINIC ONLY)
CD4 % Helper T Cell: 35 % (ref 33–65)
CD4 T Cell Abs: 397 /uL — ABNORMAL LOW (ref 400–1790)

## 2019-02-26 LAB — HIV-1 RNA QUANT-NO REFLEX-BLD
HIV 1 RNA Quant: <20 copies/mL
HIV-1 RNA Quant, Log: <1.3 Log copies/mL

## 2019-02-26 LAB — CBC WITH DIFFERENTIAL/PLATELET
Absolute Monocytes: 286 cells/uL (ref 200–950)
Basophils Absolute: 19 cells/uL (ref 0–200)
Basophils Relative: 0.8 %
Eosinophils Absolute: 346 cells/uL (ref 15–500)
Eosinophils Relative: 14.4 %
HCT: 37.4 % (ref 35.0–45.0)
Lymphs Abs: 1109 cells/uL (ref 850–3900)
MCH: 31.3 pg (ref 27.0–33.0)
MCHC: 34.2 g/dL (ref 32.0–36.0)
MCV: 91.4 fL (ref 80.0–100.0)
MPV: 10.3 fL (ref 7.5–12.5)
Monocytes Relative: 11.9 %
Neutro Abs: 641 cells/uL — ABNORMAL LOW (ref 1500–7800)
Neutrophils Relative %: 26.7 %
Platelets: 266 10*3/uL (ref 140–400)
RBC: 4.09 10*6/uL (ref 3.80–5.10)
RDW: 13.1 % (ref 11.0–15.0)
Total Lymphocyte: 46.2 %

## 2019-02-26 LAB — COMPLETE METABOLIC PANEL WITH GFR
AG Ratio: 1 (calc) (ref 1.0–2.5)
ALT: 11 U/L (ref 6–29)
AST: 17 U/L (ref 10–35)
Albumin: 3.7 g/dL (ref 3.6–5.1)
Alkaline phosphatase (APISO): 88 U/L (ref 31–125)
BUN: 12 mg/dL (ref 7–25)
CO2: 25 mmol/L (ref 20–32)
Calcium: 8.8 mg/dL (ref 8.6–10.2)
Chloride: 106 mmol/L (ref 98–110)
Creat: 0.66 mg/dL (ref 0.50–1.10)
GFR, Est African American: 121 mL/min/{1.73_m2} (ref >=60)
GFR, Est Non African American: 104 mL/min/{1.73_m2} (ref >=60)
Globulin: 3.6 g/dL (calc) (ref 1.9–3.7)
Glucose, Bld: 98 mg/dL (ref 65–99)
Potassium: 4.5 mmol/L (ref 3.5–5.3)
Sodium: 138 mmol/L (ref 135–146)
Total Bilirubin: 0.4 mg/dL (ref 0.2–1.2)
Total Protein: 7.3 g/dL (ref 6.1–8.1)

## 2019-02-26 LAB — RPR: RPR Ser Ql: NONREACTIVE

## 2019-03-04 NOTE — Telephone Encounter (Addendum)
[facility] Patient Advocate Encounter  Patient is over income for [facility] and will need to renew her application in January. In order to submit the application, we will need an adap denial letter because the patient was enrolled in the [place] program adap eligible.     Adap denial letter was sent to [facility] and received call back that the company mispoke, they don't need the denial letter they need income documents. Left message for the patient to call us  03/19/2019 12:46p.

## 2019-04-16 ENCOUNTER — Telehealth: Payer: Self-pay

## 2019-04-16 NOTE — Telephone Encounter (Signed)
Received call today from pharmacy stating they are having issues reaching patient regarding refills. Contact information is the same as in chart. Pharmacy will close chart until patient reaches out to them regarding refills. [place]

## 2019-04-17 ENCOUNTER — Telehealth: Payer: Self-pay | Admitting: *Deleted

## 2019-04-17 NOTE — Telephone Encounter (Signed)
Per [facility], patient does not have active coverage. She has been using a trial card for her medication 10/2018 - 02/2019, last fill was 03/09/2019. UMAP not active. Patient was advised by patient to contact [facility]. Patients number [id number] Landis Gandy, RN

## 2019-04-17 NOTE — Telephone Encounter (Signed)
Attempted to reach patient in January to complete the application process but was unsuccessful at obtaining the necessary documents. The adap denial letter alone was not enough for approval. I called back today and spoke to the patient but she said she would call back when I asked if she could provide [place] with proof of income.

## 2019-04-21 ENCOUNTER — Telehealth: Payer: Self-pay | Admitting: Pharmacy Technician

## 2019-04-21 NOTE — Telephone Encounter (Addendum)
[facility] Patient Advocate Encounter   Patient has been approved for [employer] Advancing Access Patient Assistance Program for Genvoya from 04/21/2019 to 03/11/2020. This assistance will make the patient's copay $0.  I have spoken with the patient and they will pick up at [facility]. They had to order the medicine and should be in the store for pick up 02/10. They will call the patient when it arrives.   The billing information is.  Member ID: [id number] [place]: [id number] PCN: [id number] Group: [id number]  Patient knows to call the office with questions or concerns.  Bartholomew Crews, CPhT Specialty Pharmacy Patient [facility] for Infectious Disease Phone: [phone] Fax: [phone] 04/21/2019 12:44 PM

## 2019-06-01 ENCOUNTER — Ambulatory Visit: Payer: Self-pay | Admitting: Internal Medicine

## 2019-08-13 ENCOUNTER — Telehealth: Payer: Self-pay

## 2019-08-13 NOTE — Telephone Encounter (Signed)
Refill request for hydrocortisone 1% cream. Last refill in 2019. Sending request to MD for approval.  Carolyn Williamson

## 2019-08-21 NOTE — Telephone Encounter (Signed)
Yes please renew for 6 months

## 2019-08-24 MED ORDER — HYDROCORTISONE 1 % EX OINT: 1.0000 "application " | TOPICAL_OINTMENT | Freq: Two times a day (BID) | CUTANEOUS | 5 refills | Status: DC

## 2019-08-24 NOTE — Addendum Note (Signed)
Addended by: Eugenia Mcalpine on: 08/24/2019 09:37 AM   Modules accepted: Orders

## 2019-10-14 ENCOUNTER — Other Ambulatory Visit: Payer: Self-pay | Admitting: Internal Medicine

## 2019-10-14 DIAGNOSIS — B20 Human immunodeficiency virus [HIV] disease: Secondary | ICD-10-CM

## 2019-11-11 ENCOUNTER — Other Ambulatory Visit: Payer: Self-pay | Admitting: Internal Medicine

## 2019-11-11 DIAGNOSIS — B20 Human immunodeficiency virus [HIV] disease: Secondary | ICD-10-CM

## 2020-01-04 ENCOUNTER — Encounter: Payer: Self-pay | Admitting: Internal Medicine

## 2020-01-04 ENCOUNTER — Ambulatory Visit: Payer: Self-pay

## 2020-01-04 ENCOUNTER — Ambulatory Visit (INDEPENDENT_AMBULATORY_CARE_PROVIDER_SITE_OTHER): Payer: Self-pay | Admitting: Internal Medicine

## 2020-01-04 ENCOUNTER — Other Ambulatory Visit: Payer: Self-pay

## 2020-01-04 VITALS — BP 131/80 | HR 63 | Temp 98.0°F | Ht 64.0 in | Wt 166.0 lb

## 2020-01-04 DIAGNOSIS — Z0000 Encounter for general adult medical examination without abnormal findings: Secondary | ICD-10-CM

## 2020-01-04 DIAGNOSIS — B20 Human immunodeficiency virus [HIV] disease: Secondary | ICD-10-CM

## 2020-01-04 DIAGNOSIS — Z789 Other specified health status: Secondary | ICD-10-CM

## 2020-01-04 MED ORDER — BICTEGRAVIR-EMTRICITAB-TENOFOV 50-200-25 MG PO TABS: 1.0000 | ORAL_TABLET | Freq: Every day | ORAL | 11 refills | Status: DC

## 2020-01-04 NOTE — Progress Notes (Signed)
RFV: follow up for hiv disease  Patient ID: Carolyn Williamson, female   DOB: Mar 16, 1970, 49 y.o.   MRN: [id number]  HPI  Carolyn Williamson is a 49yo F with well controlled hiv disease, on genvoya. Has genvoya - was out of meds 3 months. She didn't realize that she could get her appointment moved up Noticed difference off of meds, fatigued.  She also mentioned that she had high out of pocket cost for mammogram so she didn't get mammo this past year. Also she received large bill of $600 from clinic for which she paid off.  Outpatient Encounter Medications as of 01/04/2020  Medication Sig  . GENVOYA 150-150-200-10 MG TABS tablet TAKE 1 TABLET BY MOUTH DAILY WITH BREAKFAST  . hydrocortisone 1 % ointment Apply 1 application topically 2 (two) times daily.  Marland Kitchen ketotifen (ALLERGY EYE DROPS) 0.025 % ophthalmic solution Place 1 drop into both eyes 2 (two) times daily. (Patient not taking: Reported on 09/15/2018)  . nystatin (MYCOSTATIN/NYSTOP) powder Apply topically 3 (three) times daily. appliquer entre vos orteils trois fois par jour (Patient not taking: Reported on 09/15/2018)  . omeprazole (PRILOSEC) 20 MG capsule Take 1 capsule (20 mg total) by mouth daily. (Patient not taking: Reported on 09/15/2018)  . pregabalin (LYRICA) 50 MG capsule Take 1 capsule (50 mg total) by mouth 2 (two) times daily. (Patient not taking: Reported on 09/15/2018)   Facility-Administered Encounter Medications as of 01/04/2020  Medication  . loperamide (IMODIUM) capsule 2 mg     Patient Active Problem List   Diagnosis Date Noted  . 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 Bilateral leg edema 02/25/2014  . Abdominal muscle strain 01/29/2014  . Premature menopause 01/06/2014  . Seasonal allergies 01/06/2014  . Low back pain 11/30/2013  . Abdominal pain, epigastric 10/21/2013  . Myopia 10/21/2013  . Dental caries 10/21/2013  . Immigrant with language difficulty 09/14/2013  . Refugee health examination 09/14/2013  . HIV disease  ([place]) 09/14/2013  . Acne vulgaris 09/14/2013     Health Maintenance Due  Topic Date Due  . MAMMOGRAM  02/21/2019  . INFLUENZA VACCINE  10/11/2019     Review of Systems Review of Systems  Constitutional: Negative for fever, chills, diaphoresis, activity change, appetite change, fatigue and unexpected weight change.  HENT: Negative for congestion, sore throat, rhinorrhea, sneezing, trouble swallowing and sinus pressure.  Eyes: Negative for photophobia and visual disturbance.  Respiratory: Negative for cough, chest tightness, shortness of breath, wheezing and stridor.  Cardiovascular: Negative for chest pain, palpitations and leg swelling.  Gastrointestinal: Negative for nausea, vomiting, abdominal pain, diarrhea, constipation, blood in stool, abdominal distention and anal bleeding.  Genitourinary: Negative for dysuria, hematuria, flank pain and difficulty urinating.  Musculoskeletal: Negative for myalgias, back pain, joint swelling, arthralgias and gait problem.  Skin: Negative for color change, pallor, rash and wound.  Neurological: Negative for dizziness, tremors, weakness and light-headedness.  Hematological: Negative for adenopathy. Does not bruise/bleed easily.  Psychiatric/Behavioral: Negative for behavioral problems, confusion, sleep disturbance, dysphoric mood, decreased concentration and agitation.   Sochx: no smoking or drinking  Physical Exam   BP 131/80   Pulse 63   Temp 98 F (36.7 C)   Ht 5\' 4"  (1.626 m)   Wt 166 lb (75.3 kg)   LMP 05/09/2013   BMI 28.49 kg/m  Physical Exam  Constitutional:  oriented to person, place, and time. appears well-developed and well-nourished. No distress.  HENT: [place]/AT, PERRLA, no scleral icterus Mouth/Throat: Oropharynx is clear and moist. No  oropharyngeal exudate.  Cardiovascular: Normal rate, regular rhythm and normal heart sounds. Exam reveals no gallop and no friction rub.  No murmur heard.  Pulmonary/Chest: Effort normal and  breath sounds normal. No respiratory distress.  has no wheezes.  Neck = supple, no nuchal rigidity Abdominal: Soft. Bowel sounds are normal.  exhibits no distension. There is no tenderness.  Lymphadenopathy: no cervical adenopathy. No axillary adenopathy Neurological: alert and oriented to person, place, and time.  Skin: Skin is warm and dry. No rash noted. No erythema.  Psychiatric: a normal mood and affect.  behavior is normal.    Lab Results  Component Value Date   CD4TCELL 35 02/23/2019   Lab Results  Component Value Date   CD4TABS 397 (L) 02/23/2019   CD4TABS 288 (L) 09/15/2018   CD4TABS 380 (L) 02/19/2018   Lab Results  Component Value Date   HIV1RNAQUANT <20 NOT DETECTED 02/23/2019   Lab Results  Component Value Date   HEPBSAB NEG 10/06/2013   Lab Results  Component Value Date   LABRPR NON-REACTIVE 02/23/2019    CBC Lab Results  Component Value Date   WBC 2.4 (L) 02/23/2019   RBC 4.09 02/23/2019   HGB 12.8 02/23/2019   HCT 37.4 02/23/2019   PLT 266 02/23/2019   MCV 91.4 02/23/2019   MCH 31.3 02/23/2019   MCHC 34.2 02/23/2019   RDW 13.1 02/23/2019   LYMPHSABS 1,109 02/23/2019   MONOABS 154 (L) 10/02/2016   EOSABS 346 02/23/2019    BMET Lab Results  Component Value Date   NA 138 02/23/2019   K 4.5 02/23/2019   CL 106 02/23/2019   CO2 25 02/23/2019   GLUCOSE 98 02/23/2019   BUN 12 02/23/2019   CREATININE 0.66 02/23/2019   CALCIUM 8.8 02/23/2019   GFRNONAA 104 02/23/2019   GFRAA 121 02/23/2019      Assessment and Plan  Start on biktarvy- gave 30 day sample; will check labs in 4-6 wk while on meds to see that she is back to being undetectable. Discussed the importance of not stopping meds. Adherence counseling  Loss of access to medications = ADAP paperwork - next refill will be biktarvy  Health maintenance =deferred getting flu shot. We will get her mammogram scholarship to get  Mammogram = was charged $500. Needs to make sure for  free  Had clinic bill $600 --but did pay off.

## 2020-01-14 ENCOUNTER — Other Ambulatory Visit: Payer: Self-pay

## 2020-01-14 ENCOUNTER — Ambulatory Visit: Payer: Self-pay

## 2020-01-14 ENCOUNTER — Telehealth: Payer: Self-pay

## 2020-01-14 NOTE — Telephone Encounter (Signed)
[facility] Patient Advocate Encounter  Completed and sent [employer] Advancing Access re-enrollment application for Biktarvy  for this patient who is uninsured.    Patient assistance phone number for follow up is [phone].   This encounter will be updated until final determination.,   [name], [place] Patient [facility] for Infectious Disease Phone: [phone] Fax:  [phone]

## 2020-02-01 IMAGING — MG DIGITAL SCREENING BILATERAL MAMMOGRAM WITH CAD
4 series · 4 of 4 positions shown · non-contrast
Comparison: None.

CLINICAL DATA: Screening.

EXAM:
DIGITAL SCREENING BILATERAL MAMMOGRAM WITH CAD

[R MLO]
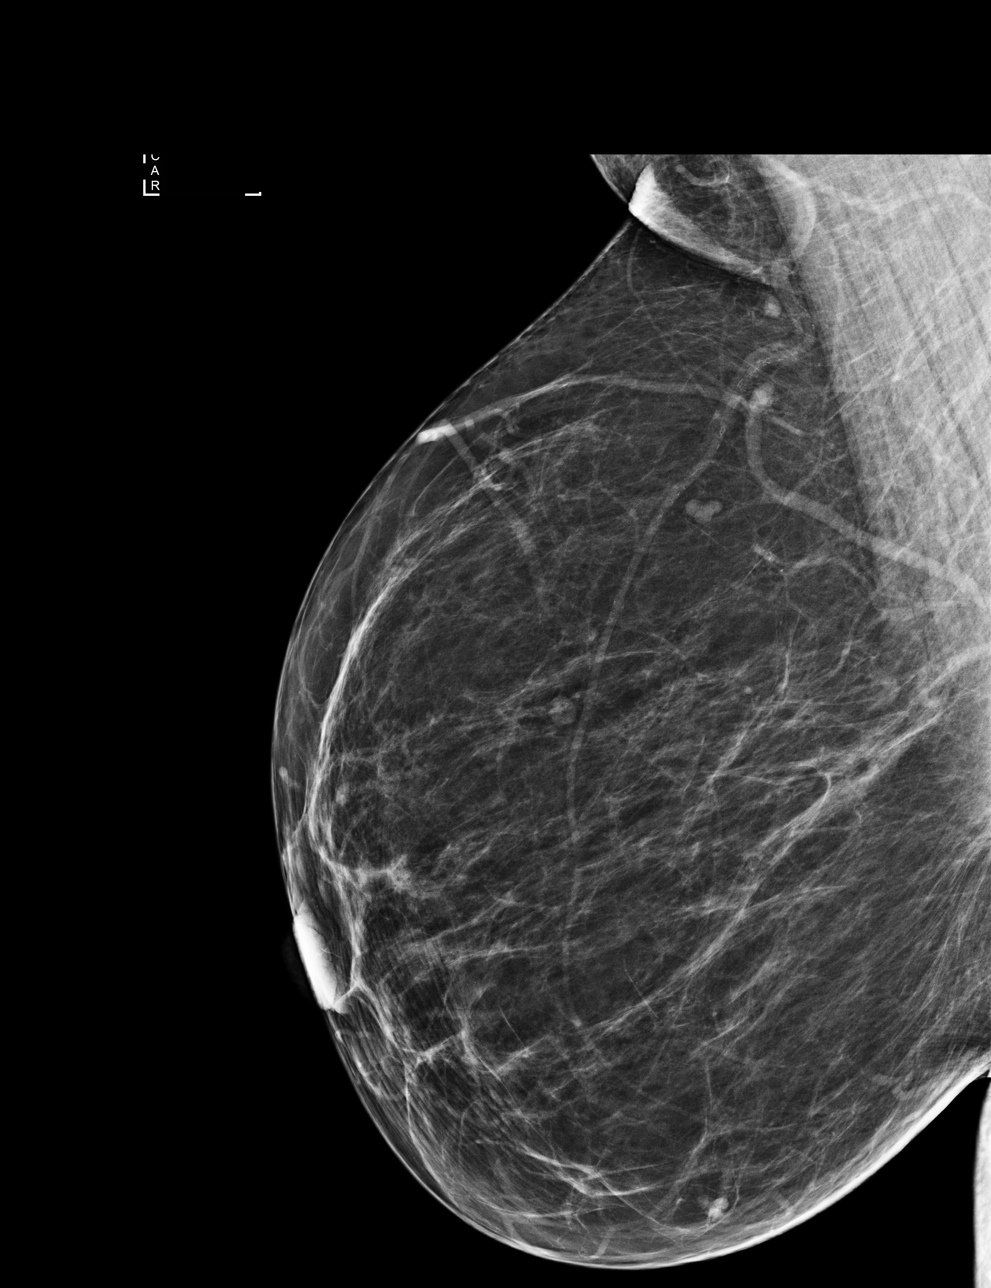

[L CC]
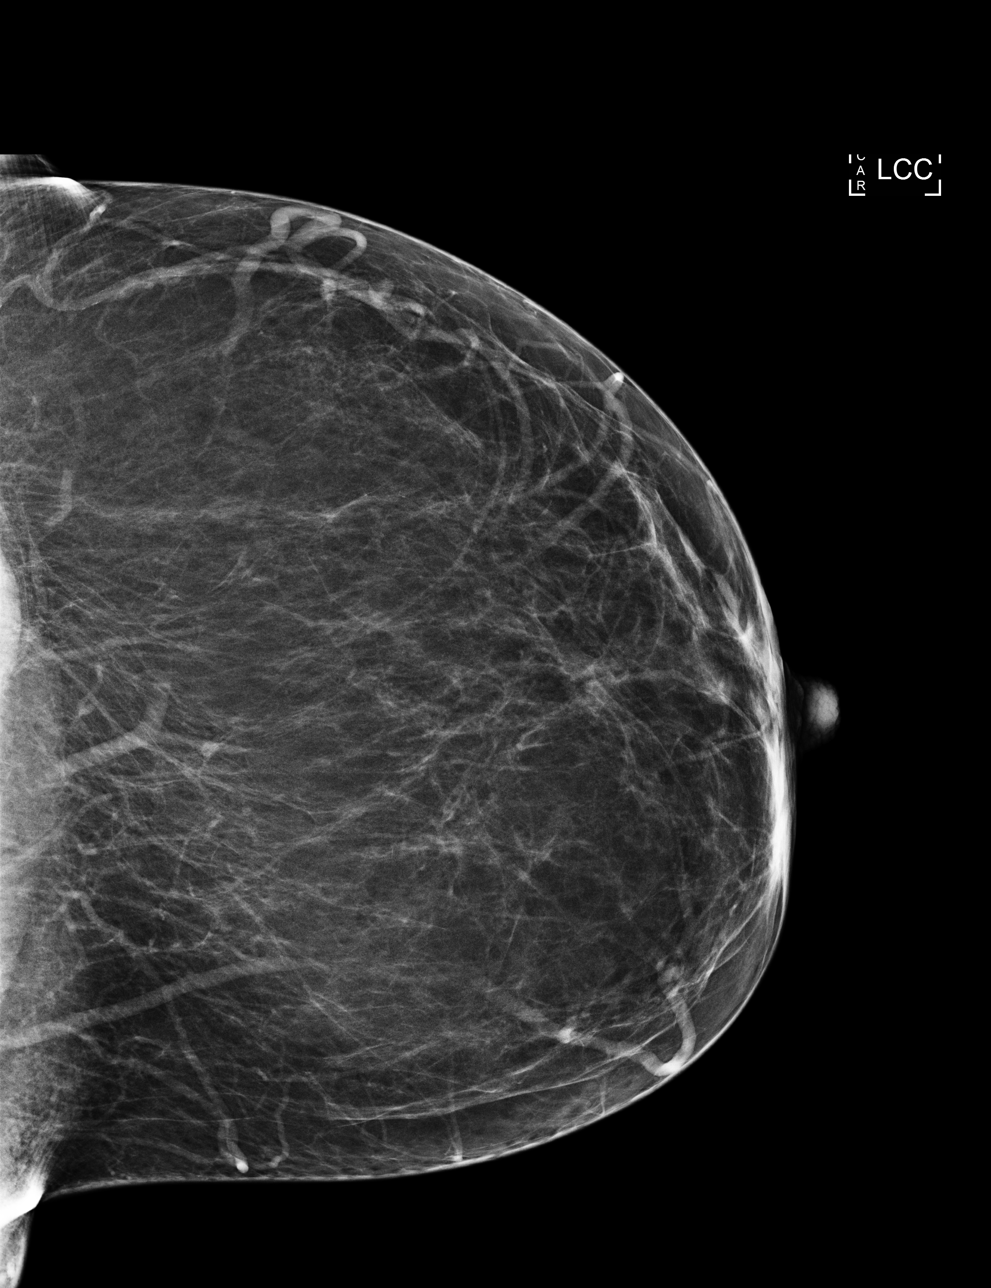

[R CC]
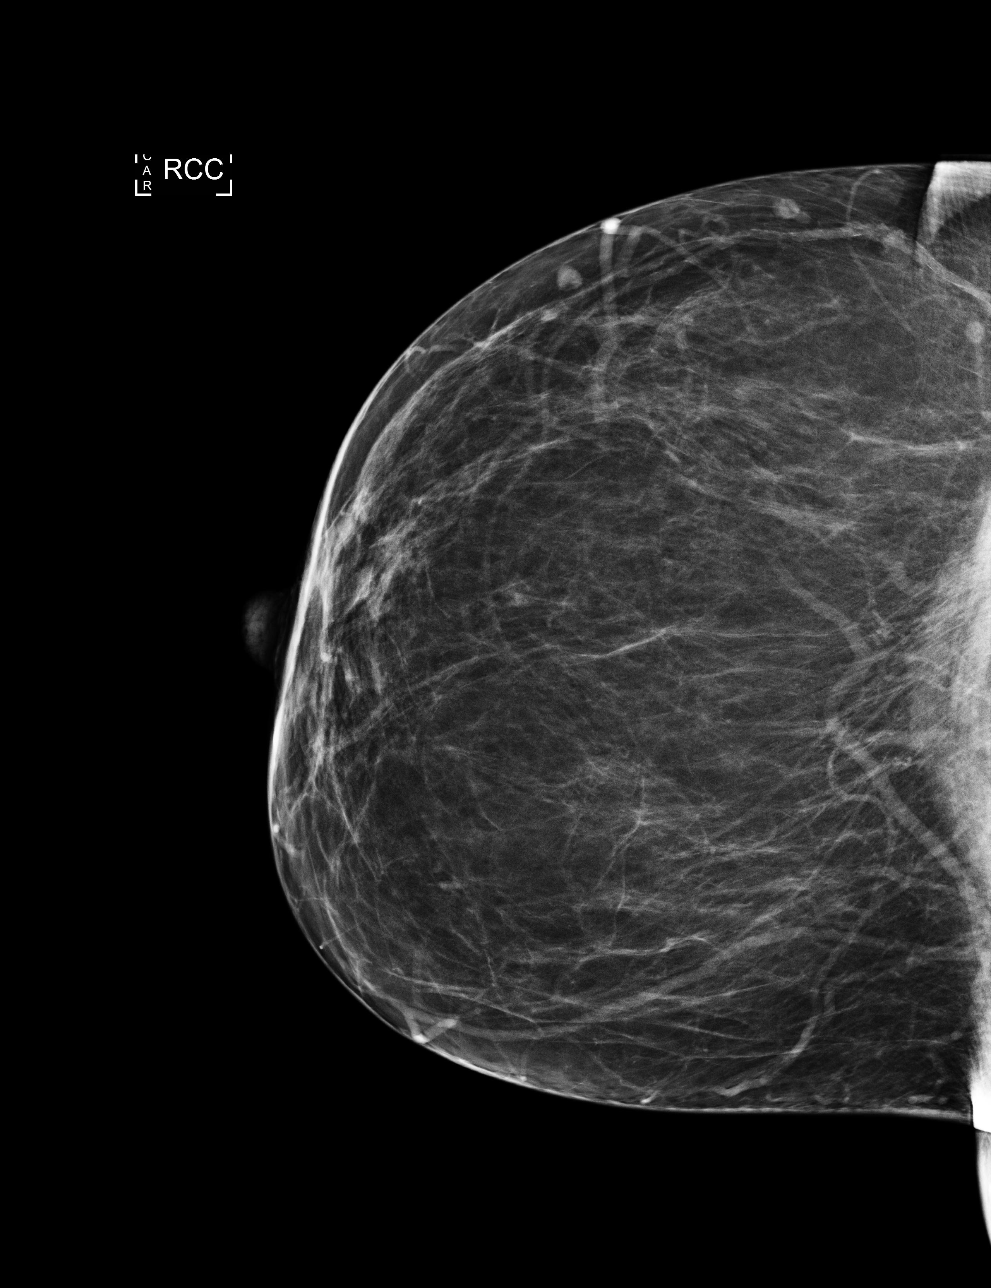

[L MLO]
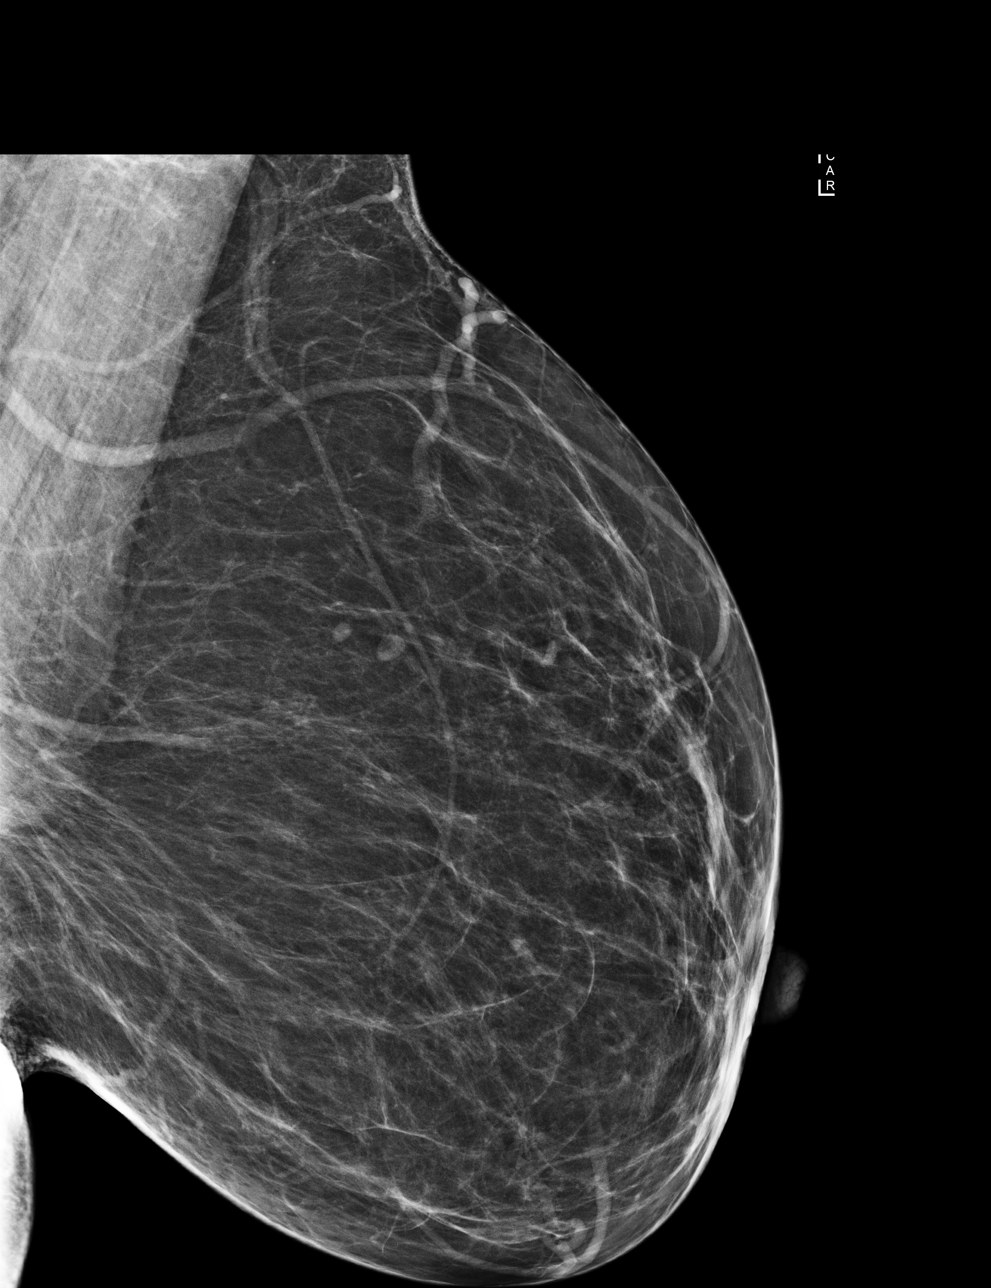

[4 of 4 positions shown; findings below may reference images not displayed]

ACR Breast Density Category b: There are scattered areas of
fibroglandular density.
FINDINGS: There are no findings suspicious for malignancy. Images were
processed with CAD.
IMPRESSION: No mammographic evidence of malignancy. A result letter of this
screening mammogram will be mailed directly to the patient.

RECOMMENDATION:
Screening mammogram in one year. (Code:[id number])

BI-RADS CATEGORY  1: Negative.

## 2020-02-15 ENCOUNTER — Ambulatory Visit: Payer: Self-pay | Admitting: Internal Medicine

## 2020-04-20 ENCOUNTER — Telehealth: Payer: Self-pay

## 2020-04-20 NOTE — Telephone Encounter (Signed)
Received call from [facility] requesting patient contact info as they have been unsuccessful in trying to reach the patient. RN provided patient's phone number, pharmacy already had that one on file.   RN attempted to contact patient to make her aware that [facility] has been trying to reach her and to schedule follow-up, no answer. Left HIPAA compliant voicemail requesting callback.   Beryle Flock, RN

## 2020-04-28 ENCOUNTER — Telehealth: Payer: Self-pay

## 2020-04-28 NOTE — Telephone Encounter (Signed)
[facility] Patient Advocate Encounter  Completed and sent [employer] Advancing Access application for [place] for this patient who is uninsured.    Patient is approved 04/28/20 through 04/28/21.  BIN      [id number] PCN    [id number] GRP    [id number] ID        [id number]   [name], [place] Patient [facility] for Infectious Disease Phone: [phone] Fax:  [phone]

## 2020-06-13 ENCOUNTER — Telehealth: Payer: Self-pay

## 2020-06-13 ENCOUNTER — Ambulatory Visit: Payer: Self-pay | Admitting: Internal Medicine

## 2020-06-13 ENCOUNTER — Other Ambulatory Visit: Payer: Self-pay

## 2020-06-13 NOTE — Telephone Encounter (Signed)
Patient walked into office today requesting HIV testing.  I advised patient it was time for her routing HIV testing and she insisted she only wanted HIV testing.   She is requesting test to take to her employer that will show her HIV status as negative.  She was informed her HIV test would always be positive but she is convinced God has healed her.  She stated she was willing to pay out of pocket for the test. I explained even though she is taking Biktarvy , the HIV antibody test will always be positive.   She is very insistent and wants to be tested so I offered her the address to the local health department, As we started to leave the room she observed Dr Baxter Flattery in her work area and wanted to share this information.  After speaking with Dr Baxter Flattery it was explained someone who works with her came to visit her home and took pictures of her medication.  She then went back to work and told all of her co-workers    Carolyn Williamson is HIV positive and no one wants to work with her and they are treated her badly. She fears she will be fired from her job because of this.   Carolyn Williamson would like to repeat the HIV test because she feel God has healed her and the test will be negative.  Dr Baxter Flattery had a detailed  conversation with the patient explaining the odds of her test converting from HIV positive to negative.    Patient is determined to be retested for HIV and was given the name, address and phone number for local health department.  I asked her speak with our financial counselor to see if she would qualify for financial assistance but she stated her employment had not changed.   Laverle Patter, RN

## 2021-01-18 ENCOUNTER — Other Ambulatory Visit: Payer: Self-pay | Admitting: Internal Medicine

## 2021-01-18 DIAGNOSIS — B20 Human immunodeficiency virus [HIV] disease: Secondary | ICD-10-CM

## 2021-01-18 NOTE — Telephone Encounter (Signed)
Called patient to schedule overdue appointment, no answer.   Beryle Flock, RN

## 2021-03-01 ENCOUNTER — Other Ambulatory Visit: Payer: Self-pay | Admitting: Internal Medicine

## 2021-03-01 DIAGNOSIS — B20 Human immunodeficiency virus [HIV] disease: Secondary | ICD-10-CM

## 2021-03-02 NOTE — Telephone Encounter (Signed)
Left VM asking patient to return my call. Patient overdue for follow up.

## 2021-03-10 NOTE — Telephone Encounter (Signed)
Appointment 03/14/21

## 2021-03-14 ENCOUNTER — Ambulatory Visit: Payer: Self-pay

## 2021-03-14 ENCOUNTER — Ambulatory Visit: Payer: Self-pay | Admitting: Infectious Diseases

## 2021-03-14 NOTE — Telephone Encounter (Signed)
Patent rescheduled appointment. Sending approval for medication to cover patient until next office visit.

## 2021-03-20 ENCOUNTER — Other Ambulatory Visit: Payer: Self-pay | Admitting: Pharmacist

## 2021-03-20 ENCOUNTER — Telehealth: Payer: Self-pay | Admitting: Pharmacist

## 2021-03-20 ENCOUNTER — Other Ambulatory Visit (HOSPITAL_COMMUNITY): Payer: Self-pay

## 2021-03-20 DIAGNOSIS — B20 Human immunodeficiency virus [HIV] disease: Secondary | ICD-10-CM

## 2021-03-20 MED ORDER — BIKTARVY 50-200-25 MG PO TABS: 1.0000 | ORAL_TABLET | Freq: Every day | ORAL | 0 refills | Status: AC

## 2021-03-20 NOTE — Telephone Encounter (Signed)
Patient's medications need to be sent to Carolyn Williamson. Patient is due for appointment with Dr. Baxter Flattery on 1/16. Will defer to filling further prescriptions until seen by Dr. Baxter Flattery.  Alfonse Spruce, PharmD, CPP Clinical Pharmacist Practitioner [facility] [place] for Infectious Disease

## 2021-03-22 ENCOUNTER — Other Ambulatory Visit (HOSPITAL_COMMUNITY): Payer: Self-pay

## 2021-03-27 ENCOUNTER — Encounter: Payer: Self-pay | Admitting: Internal Medicine

## 2021-03-27 ENCOUNTER — Other Ambulatory Visit (HOSPITAL_COMMUNITY): Payer: Self-pay

## 2021-03-27 ENCOUNTER — Other Ambulatory Visit: Payer: Self-pay

## 2021-03-27 ENCOUNTER — Ambulatory Visit (INDEPENDENT_AMBULATORY_CARE_PROVIDER_SITE_OTHER): Payer: Self-pay | Admitting: Internal Medicine

## 2021-03-27 ENCOUNTER — Ambulatory Visit: Payer: Self-pay

## 2021-03-27 VITALS — BP 110/70 | HR 83 | Temp 98.2°F | Wt 158.0 lb

## 2021-03-27 DIAGNOSIS — L309 Dermatitis, unspecified: Secondary | ICD-10-CM

## 2021-03-27 DIAGNOSIS — Z79899 Other long term (current) drug therapy: Secondary | ICD-10-CM

## 2021-03-27 DIAGNOSIS — B20 Human immunodeficiency virus [HIV] disease: Secondary | ICD-10-CM

## 2021-03-27 MED ORDER — BIKTARVY 50-200-25 MG PO TABS
1.0000 | ORAL_TABLET | Freq: Every day | ORAL | 11 refills | Status: DC
  Filled 2021-03-27 (×2): qty 30, 30d supply, fill #0

## 2021-03-27 MED ORDER — BIKTARVY 50-200-25 MG PO TABS: 1.0000 | ORAL_TABLET | Freq: Every day | ORAL | 11 refills | Status: DC

## 2021-03-27 MED ORDER — HYDROCORTISONE 1 % EX OINT: 1.0000 "application " | TOPICAL_OINTMENT | Freq: Two times a day (BID) | CUTANEOUS | 5 refills | Status: AC

## 2021-03-27 NOTE — Progress Notes (Signed)
RFV: follow up for hiv disease 12 month follow up  Patient ID: Carolyn Williamson, female   DOB: January 11, 1971, 51 y.o.   MRN: [id number]  HPI [name] is a 13yo F with well controlled hiv disease, CD 4 count of  397/VL<20 in 2020. Has been getting adap coverage. Last seen in 12/2019. Still works at [employer], but feels it is stressful. Hasn't required to be hospitalized or getting UC.   Lives on her own. Rent is $900 per month but anticipate it is going to raise.   Feels she has lost weight unintentionally due to stress, less eating.  Outpatient Encounter Medications as of 03/27/2021  Medication Sig   BIKTARVY 50-200-25 MG TABS tablet TAKE 1 TABLET BY MOUTH DAILY   hydrocortisone 1 % ointment Apply 1 application topically 2 (two) times daily.   ketotifen (ALLERGY EYE DROPS) 0.025 % ophthalmic solution Place 1 drop into both eyes 2 (two) times daily.   nystatin (MYCOSTATIN/NYSTOP) powder Apply topically 3 (three) times daily. appliquer entre vos orteils trois fois par jour (Patient not taking: Reported on 01/04/2020)   omeprazole (PRILOSEC) 20 MG capsule Take 1 capsule (20 mg total) by mouth daily. (Patient not taking: Reported on 01/04/2020)   pregabalin (LYRICA) 50 MG capsule Take 1 capsule (50 mg total) by mouth 2 (two) times daily. 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   Bilateral leg edema 02/25/2014   Abdominal muscle strain 01/29/2014   Premature menopause 01/06/2014   Seasonal allergies 01/06/2014   Low back pain 11/30/2013   Abdominal pain, epigastric 10/21/2013   Myopia 10/21/2013   Dental caries 10/21/2013   Immigrant with language difficulty 09/14/2013   Refugee health examination 09/14/2013   HIV disease ([place]) 09/14/2013   Acne vulgaris 09/14/2013      Health Maintenance Due  Topic Date Due   Zoster Vaccines- Shingrix (1 of 2) Never done   COLONOSCOPY (Pts 45-22yrs Insurance coverage will need to be confirmed)  Never done   Pneumococcal Vaccine 48-42 Years old (3 - PPSV23 if available, else PCV20) 10/07/2018   MAMMOGRAM  02/21/2019   INFLUENZA VACCINE  10/10/2020   PAP SMEAR-Modifier  01/20/2021     Review of Systems Constitutional: Negative for fever, chills, diaphoresis, activity change, appetite change, fatigue and unexpected weight change.  HENT: Negative for congestion, sore throat, rhinorrhea, sneezing, trouble swallowing and sinus pressure.  Eyes: Negative for photophobia and visual disturbance.  Respiratory: Negative for cough, chest tightness, shortness of breath, wheezing and stridor.  Cardiovascular: Negative for chest pain, palpitations and leg swelling.  Gastrointestinal: Negative for nausea, vomiting, abdominal pain, diarrhea, constipation, blood in stool, abdominal distention and anal bleeding.  Genitourinary: Negative for dysuria, hematuria, flank pain and difficulty urinating.  Musculoskeletal: Negative for myalgias, back pain, joint swelling, arthralgias and gait problem.  Skin: Negative for color change, pallor, rash and wound.  Neurological: Negative for dizziness, tremors, weakness and light-headedness.  Hematological: Negative for adenopathy. Does not bruise/bleed easily.  Psychiatric/Behavioral: Negative for behavioral problems, confusion, sleep disturbance, dysphoric mood, decreased concentration and agitation.   Physical Exam   Wt 158 lb (71.7 kg)    LMP 05/09/2013    BMI 27.12 kg/m   Physical Exam  Constitutional:  oriented to person, place, and time. appears well-developed and well-nourished. No  distress.  HENT: [place]/AT, PERRLA, no scleral icterus Mouth/Throat: Oropharynx is clear and moist. No oropharyngeal exudate.  Cardiovascular: Normal rate, regular rhythm and normal heart sounds. Exam reveals  no gallop and no friction rub.  No murmur heard.  Pulmonary/Chest: Effort normal and breath sounds normal. No respiratory distress.  has no wheezes.  Neck = supple, no nuchal rigidity Abdominal: Soft. Bowel sounds are normal.  exhibits no distension. There is no tenderness.  Lymphadenopathy: no cervical adenopathy. No axillary adenopathy Neurological: alert and oriented to person, place, and time.  Skin: Skin is warm and dry. No rash noted. No erythema.  Psychiatric: a normal mood and affect.  behavior is normal.   Lab Results  Component Value Date   CD4TCELL 35 02/23/2019   Lab Results  Component Value Date   CD4TABS 397 (L) 02/23/2019   CD4TABS 288 (L) 09/15/2018   CD4TABS 380 (L) 02/19/2018   Lab Results  Component Value Date   HIV1RNAQUANT <20 NOT DETECTED 02/23/2019   Lab Results  Component Value Date   HEPBSAB NEG 10/06/2013   Lab Results  Component Value Date   LABRPR NON-REACTIVE 02/23/2019    CBC Lab Results  Component Value Date   WBC 2.4 (L) 02/23/2019   RBC 4.09 02/23/2019   HGB 12.8 02/23/2019   HCT 37.4 02/23/2019   PLT 266 02/23/2019   MCV 91.4 02/23/2019   MCH 31.3 02/23/2019   MCHC 34.2 02/23/2019   RDW 13.1 02/23/2019   LYMPHSABS 1,109 02/23/2019   MONOABS 154 (L) 10/02/2016   EOSABS 346 02/23/2019    BMET Lab Results  Component Value Date   NA 138 02/23/2019   K 4.5 02/23/2019   CL 106 02/23/2019   CO2 25 02/23/2019   GLUCOSE 98 02/23/2019   BUN 12 02/23/2019   CREATININE 0.66 02/23/2019   CALCIUM 8.8 02/23/2019   GFRNONAA 104 02/23/2019   GFRAA 121 02/23/2019   Assessment and Plan  HIV disease= will get labs  Health maintenance = will check s

## 2021-03-27 NOTE — Progress Notes (Signed)
RFV: follow up for hiv disease, annual visit Patient ID: Carolyn Williamson, female   DOB: Jun 22, 1970, 51 y.o.   MRN: [id number]  HPI 51yo F with previously well controlled hiv disease, last seen oct 2021. Has not come back due to out of pocket cost for visits and labs. She is unable to afford cost of care. She continues to take biktarvy. Finds her work at the [employer] stressful. She is also concerned about cost of living increase in her apt rent. Not sexually active. No other health problems at this time.  Outpatient Encounter Medications as of 03/27/2021  Medication Sig   ketotifen (ALLERGY EYE DROPS) 0.025 % ophthalmic solution Place 1 drop into both eyes 2 (two) times daily.   [DISCONTINUED] BIKTARVY 50-200-25 MG TABS tablet TAKE 1 TABLET BY MOUTH DAILY   [DISCONTINUED] hydrocortisone 1 % ointment Apply 1 application topically 2 (two) times daily.   bictegravir-emtricitabine-tenofovir AF (BIKTARVY) 50-200-25 MG TABS tablet Take 1 tablet by mouth daily.   hydrocortisone 1 % ointment Apply 1 application topically 2 (two) times daily.   [DISCONTINUED] nystatin (MYCOSTATIN/NYSTOP) powder Apply topically 3 (three) times daily. appliquer entre vos orteils trois fois par jour (Patient not taking: Reported on 01/04/2020)   [DISCONTINUED] omeprazole (PRILOSEC) 20 MG capsule Take 1 capsule (20 mg total) by mouth daily. (Patient not taking: Reported on 01/04/2020)   [DISCONTINUED] pregabalin (LYRICA) 50 MG capsule Take 1 capsule (50 mg total) by mouth 2 (two) times daily. (Patient not taking: Reported on 01/04/2020)   [DISCONTINUED] loperamide (IMODIUM) capsule 2 mg    No facility-administered encounter medications on file as of 03/27/2021.     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   Bilateral leg edema 02/25/2014   Abdominal muscle strain 01/29/2014   Premature menopause 01/06/2014   Seasonal allergies 01/06/2014   Low back pain  11/30/2013   Abdominal pain, epigastric 10/21/2013   Myopia 10/21/2013   Dental caries 10/21/2013   Immigrant with language difficulty 09/14/2013   Refugee health examination 09/14/2013   HIV disease ([place]) 09/14/2013   Acne vulgaris 09/14/2013     Health Maintenance Due  Topic Date Due   Zoster Vaccines- Shingrix (1 of 2) Never done   COLONOSCOPY (Pts 45-82yrs Insurance coverage will need to be confirmed)  Never done   Pneumococcal Vaccine 58-67 Years old (3 - PPSV23 if available, else PCV20) 10/07/2018   MAMMOGRAM  02/21/2019   INFLUENZA VACCINE  10/10/2020   PAP SMEAR-Modifier  01/20/2021    Sochx: no drinkgin or smoking Review of Systems Review of Systems  Constitutional: Negative for fever, chills, diaphoresis, activity change, appetite change, fatigue and unexpected weight change.  HENT: Negative for congestion, sore throat, rhinorrhea, sneezing, trouble swallowing and sinus pressure.  Eyes: Negative for photophobia and visual disturbance.  Respiratory: Negative for cough, chest tightness, shortness of breath, wheezing and stridor.  Cardiovascular: Negative for chest pain, palpitations and leg swelling.  Gastrointestinal: Negative for nausea, vomiting, abdominal pain, diarrhea, constipation, blood in stool, abdominal distention and anal bleeding.  Genitourinary: Negative for dysuria, hematuria, flank pain and difficulty urinating.  Musculoskeletal: Negative for myalgias, back pain, joint swelling, arthralgias and gait problem.  Skin: Negative for color change, pallor, rash and wound.  Neurological: Negative for dizziness, tremors, weakness and light-headedness.  Hematological: Negative for adenopathy. Does not bruise/bleed easily.  Psychiatric/Behavioral: Negative for behavioral problems, confusion, sleep disturbance, dysphoric mood, decreased concentration and agitation.   Physical Exam  BP 110/70    Pulse 83    Temp 98.2 F (36.8 C) (Temporal)    Wt 158 lb (71.7 kg)     LMP 05/09/2013    SpO2 100%    BMI 27.12 kg/m   Physical Exam  Constitutional:  oriented to person, place, and time. appears well-developed and well-nourished. No distress.  HENT: [place]/AT, PERRLA, no scleral icterus Mouth/Throat: Oropharynx is clear and moist. No oropharyngeal exudate.  Cardiovascular: Normal rate, regular rhythm and normal heart sounds. Exam reveals no gallop and no friction rub.  No murmur heard.  Pulmonary/Chest: Effort normal and breath sounds normal. No respiratory distress.  has no wheezes.  Neck = supple, no nuchal rigidity Abdominal: Soft. Bowel sounds are normal.  exhibits no distension. There is no tenderness.  Lymphadenopathy: no cervical adenopathy. No axillary adenopathy Neurological: alert and oriented to person, place, and time.  Skin: Skin is warm and dry. No rash noted. No erythema.  Psychiatric: a normal mood and affect.  behavior is normal.   Lab Results  Component Value Date   CD4TCELL 35 02/23/2019   Lab Results  Component Value Date   CD4TABS 397 (L) 02/23/2019   CD4TABS 288 (L) 09/15/2018   CD4TABS 380 (L) 02/19/2018   Lab Results  Component Value Date   HIV1RNAQUANT <20 NOT DETECTED 02/23/2019   Lab Results  Component Value Date   HEPBSAB NEG 10/06/2013   Lab Results  Component Value Date   LABRPR NON-REACTIVE 02/23/2019    CBC Lab Results  Component Value Date   WBC 2.4 (L) 02/23/2019   RBC 4.09 02/23/2019   HGB 12.8 02/23/2019   HCT 37.4 02/23/2019   PLT 266 02/23/2019   MCV 91.4 02/23/2019   MCH 31.3 02/23/2019   MCHC 34.2 02/23/2019   RDW 13.1 02/23/2019   LYMPHSABS 1,109 02/23/2019   MONOABS 154 (L) 10/02/2016   EOSABS 346 02/23/2019    BMET Lab Results  Component Value Date   NA 138 02/23/2019   K 4.5 02/23/2019   CL 106 02/23/2019   CO2 25 02/23/2019   GLUCOSE 98 02/23/2019   BUN 12 02/23/2019   CREATININE 0.66 02/23/2019   CALCIUM 8.8 02/23/2019   GFRNONAA 104 02/23/2019   GFRAA 121 02/23/2019       Assessment and Plan HIV disease= refill biktarvy, needs ADAP coverage.will get labs today Explained that her steady income during the week would qualify her for adap and medical coverage  Long term medication management= will check cr as well  Health maintenance =defer flu and covid booster (did have 2 doses of covid vaccine).needs mammogram scholarship  Rash - refill hydrocortisone

## 2021-03-29 LAB — COMPLETE METABOLIC PANEL WITH GFR
AG Ratio: 1 (calc) (ref 1.0–2.5)
ALT: 11 U/L (ref 6–29)
AST: 17 U/L (ref 10–35)
Albumin: 3.9 g/dL (ref 3.6–5.1)
Alkaline phosphatase (APISO): 94 U/L (ref 37–153)
BUN: 9 mg/dL (ref 7–25)
CO2: 27 mmol/L (ref 20–32)
Calcium: 9.2 mg/dL (ref 8.6–10.4)
Chloride: 106 mmol/L (ref 98–110)
Creat: 0.64 mg/dL (ref 0.50–1.03)
Globulin: 3.8 g/dL (calc) — ABNORMAL HIGH (ref 1.9–3.7)
Glucose, Bld: 99 mg/dL (ref 65–99)
Potassium: 4.6 mmol/L (ref 3.5–5.3)
Sodium: 139 mmol/L (ref 135–146)
Total Bilirubin: 0.5 mg/dL (ref 0.2–1.2)
Total Protein: 7.7 g/dL (ref 6.1–8.1)
eGFR: 108 mL/min/{1.73_m2} (ref >=60)

## 2021-03-29 LAB — CBC WITH DIFFERENTIAL/PLATELET
Absolute Monocytes: 390 cells/uL (ref 200–950)
Basophils Absolute: 21 cells/uL (ref 0–200)
Basophils Relative: 0.7 %
Eosinophils Absolute: 222 cells/uL (ref 15–500)
Eosinophils Relative: 7.4 %
HCT: 40.7 % (ref 35.0–45.0)
Hemoglobin: 13.5 g/dL (ref 11.7–15.5)
Lymphs Abs: 984 cells/uL (ref 850–3900)
MCH: 30 pg (ref 27.0–33.0)
MCHC: 33.2 g/dL (ref 32.0–36.0)
MCV: 90.4 fL (ref 80.0–100.0)
MPV: 11 fL (ref 7.5–12.5)
Monocytes Relative: 13 %
Neutro Abs: 1383 cells/uL — ABNORMAL LOW (ref 1500–7800)
Neutrophils Relative %: 46.1 %
Platelets: 230 10*3/uL (ref 140–400)
RBC: 4.5 10*6/uL (ref 3.80–5.10)
RDW: 13.2 % (ref 11.0–15.0)
Total Lymphocyte: 32.8 %
WBC: 3 10*3/uL — ABNORMAL LOW (ref 3.8–10.8)

## 2021-03-29 LAB — HIV-1 RNA QUANT-NO REFLEX-BLD
HIV 1 RNA Quant: NOT DETECTED Copies/mL
HIV-1 RNA Quant, Log: NOT DETECTED Log cps/mL

## 2021-03-29 LAB — RPR: RPR Ser Ql: NONREACTIVE

## 2021-03-31 ENCOUNTER — Other Ambulatory Visit (HOSPITAL_COMMUNITY): Payer: Self-pay

## 2021-04-17 ENCOUNTER — Other Ambulatory Visit (HOSPITAL_COMMUNITY): Payer: Self-pay

## 2021-04-19 ENCOUNTER — Other Ambulatory Visit (HOSPITAL_COMMUNITY): Payer: Self-pay

## 2021-04-21 ENCOUNTER — Other Ambulatory Visit (HOSPITAL_COMMUNITY): Payer: Self-pay

## 2021-05-03 ENCOUNTER — Other Ambulatory Visit (HOSPITAL_COMMUNITY): Payer: Self-pay

## 2021-05-04 ENCOUNTER — Other Ambulatory Visit (HOSPITAL_COMMUNITY): Payer: Self-pay

## 2021-05-08 ENCOUNTER — Other Ambulatory Visit (HOSPITAL_COMMUNITY): Payer: Self-pay

## 2021-05-13 ENCOUNTER — Other Ambulatory Visit: Payer: Self-pay | Admitting: Internal Medicine

## 2021-05-13 DIAGNOSIS — B20 Human immunodeficiency virus [HIV] disease: Secondary | ICD-10-CM

## 2021-05-13 DIAGNOSIS — Z1231 Encounter for screening mammogram for malignant neoplasm of breast: Secondary | ICD-10-CM

## 2022-01-22 ENCOUNTER — Other Ambulatory Visit: Payer: Self-pay

## 2022-01-22 ENCOUNTER — Ambulatory Visit: Payer: Self-pay

## 2022-03-26 ENCOUNTER — Ambulatory Visit (INDEPENDENT_AMBULATORY_CARE_PROVIDER_SITE_OTHER): Payer: Self-pay | Admitting: Internal Medicine

## 2022-03-26 ENCOUNTER — Encounter: Payer: Self-pay | Admitting: Internal Medicine

## 2022-03-26 ENCOUNTER — Other Ambulatory Visit: Payer: Self-pay

## 2022-03-26 VITALS — BP 137/79 | HR 63 | Temp 97.3°F | Wt 167.0 lb

## 2022-03-26 DIAGNOSIS — Z1231 Encounter for screening mammogram for malignant neoplasm of breast: Secondary | ICD-10-CM

## 2022-03-26 DIAGNOSIS — B20 Human immunodeficiency virus [HIV] disease: Secondary | ICD-10-CM

## 2022-03-26 MED ORDER — BIKTARVY 50-200-25 MG PO TABS: 1.0000 | ORAL_TABLET | Freq: Every day | ORAL | 11 refills | Status: DC

## 2022-03-26 MED ORDER — ROSUVASTATIN CALCIUM 10 MG PO TABS: 10.0000 mg | ORAL_TABLET | Freq: Every day | ORAL | 11 refills | Status: DC

## 2022-03-26 NOTE — Progress Notes (Signed)
RFV: follow up for hiv disease, annual visit  Patient ID: Carolyn Williamson, female   DOB: Aug 03, 1970, 52 y.o.   MRN: [id number]  HPI Carolyn Williamson is a 52yo F with well controlled hiv disease, in January.   She works 2 jobs - will need to check to see if ADAP   Has not had mammo in several years  Outpatient Encounter Medications as of 03/26/2022  Medication Sig   bictegravir-emtricitabine-tenofovir AF (BIKTARVY) 50-200-25 MG TABS tablet Take 1 tablet by mouth daily.   ketotifen (ALLERGY EYE DROPS) 0.025 % ophthalmic solution Place 1 drop into both eyes 2 (two) times daily.   hydrocortisone 1 % ointment Apply 1 application topically 2 (two) times daily. (Patient not taking: Reported on 03/26/2022)   No facility-administered encounter medications on file as of 03/26/2022.     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   Bilateral leg edema 02/25/2014   Abdominal muscle strain 01/29/2014   Premature menopause 01/06/2014   Seasonal allergies 01/06/2014   Low back pain 11/30/2013   Abdominal pain, epigastric 10/21/2013   Myopia 10/21/2013   Dental caries 10/21/2013   Immigrant with language difficulty 09/14/2013   Refugee health examination 09/14/2013   HIV disease ([place]) 09/14/2013   Acne vulgaris 09/14/2013     Health Maintenance Due  Topic Date Due   Zoster Vaccines- Shingrix (1 of 2) Never done   COLONOSCOPY (Pts 45-40yr Insurance coverage will need to be confirmed)  Never done   MAMMOGRAM  02/21/2019   PAP SMEAR-Modifier  01/20/2021   INFLUENZA VACCINE  10/10/2021     Review of Systems Review of Systems  Constitutional: Negative for fever, chills, diaphoresis, activity change, appetite change, fatigue and unexpected weight change.  HENT: Negative for congestion, sore throat, rhinorrhea, sneezing, trouble swallowing and sinus pressure.  Eyes: Negative for photophobia and visual disturbance.  Respiratory: Negative for cough,  chest tightness, shortness of breath, wheezing and stridor.  Cardiovascular: Negative for chest pain, palpitations and leg swelling.  Gastrointestinal: Negative for nausea, vomiting, abdominal pain, diarrhea, constipation, blood in stool, abdominal distention and anal bleeding.  Genitourinary: Negative for dysuria, hematuria, flank pain and difficulty urinating.  Musculoskeletal: Negative for myalgias, back pain, joint swelling, arthralgias and gait problem.  Skin: Negative for color change, pallor, rash and wound.  Neurological: Negative for dizziness, tremors, weakness and light-headedness.  Hematological: Negative for adenopathy. Does not bruise/bleed easily.  Psychiatric/Behavioral: Negative for behavioral problems, confusion, sleep disturbance, dysphoric mood, decreased concentration and agitation.   Physical Exam   BP 137/79   Pulse 63   Temp (!) 97.3 F (36.3 C) (Oral)   Wt 167 lb (75.8 kg)   LMP 04/16/2013   SpO2 100%   BMI 28.67 kg/m   Physical Exam  Constitutional:  oriented to person, place, and time. appears well-developed and well-nourished. No distress.  HENT: [place]/AT, PERRLA, no scleral icterus Mouth/Throat: Oropharynx is clear and moist. No oropharyngeal exudate.  Cardiovascular: Normal rate, regular rhythm and normal heart sounds. Exam reveals no gallop and no friction rub.  No murmur heard.  Pulmonary/Chest: Effort normal and breath sounds normal. No respiratory distress.  has no wheezes.  Neck = supple, no nuchal rigidity Abdominal: Soft. Bowel sounds are normal.  exhibits no distension. There is no tenderness.  Lymphadenopathy: no cervical adenopathy. No axillary adenopathy Neurological: alert and oriented to person, place, and time.  Skin: Skin is warm and dry. No rash noted. No erythema.  Psychiatric: a normal mood and affect.  behavior is normal.   Lab Results  Component Value Date   CD4TCELL 35 02/23/2019   Lab Results  Component Value Date   CD4TABS 397  (L) 02/23/2019   CD4TABS 288 (L) 09/15/2018   CD4TABS 380 (L) 02/19/2018   Lab Results  Component Value Date   HIV1RNAQUANT Not Detected 03/27/2021   Lab Results  Component Value Date   HEPBSAB NEG 10/06/2013   Lab Results  Component Value Date   LABRPR NON-REACTIVE 03/27/2021    CBC Lab Results  Component Value Date   WBC 3.0 (L) 03/27/2021   RBC 4.50 03/27/2021   HGB 13.5 03/27/2021   HCT 40.7 03/27/2021   PLT 230 03/27/2021   MCV 90.4 03/27/2021   MCH 30.0 03/27/2021   MCHC 33.2 03/27/2021   RDW 13.2 03/27/2021   LYMPHSABS 984 03/27/2021   MONOABS 154 (L) 10/02/2016   EOSABS 222 03/27/2021    BMET Lab Results  Component Value Date   NA 139 03/27/2021   K 4.6 03/27/2021   CL 106 03/27/2021   CO2 27 03/27/2021   GLUCOSE 99 03/27/2021   BUN 9 03/27/2021   CREATININE 0.64 03/27/2021   CALCIUM 9.2 03/27/2021   GFRNONAA 104 02/23/2019   GFRAA 121 02/23/2019      Assessment and Plan  HIV disease = will do labs  Health maintenance = will do daily rosuvastatin, per retrieve trial  Women's healthy = will get appt for gyn visit with stephanie on mondays  Needs mammogram scholarship and on Monday visits

## 2022-03-27 LAB — T-HELPER CELL (CD4) - (RCID CLINIC ONLY)
CD4 % Helper T Cell: 36 % (ref 33–65)
CD4 T Cell Abs: 571 /uL (ref 400–1790)

## 2022-03-29 LAB — COMPLETE METABOLIC PANEL WITH GFR
AG Ratio: 1.1 (calc) (ref 1.0–2.5)
ALT: 10 U/L (ref 6–29)
AST: 18 U/L (ref 10–35)
Albumin: 3.9 g/dL (ref 3.6–5.1)
Alkaline phosphatase (APISO): 92 U/L (ref 37–153)
BUN: 12 mg/dL (ref 7–25)
CO2: 26 mmol/L (ref 20–32)
Calcium: 9.1 mg/dL (ref 8.6–10.4)
Chloride: 107 mmol/L (ref 98–110)
Creat: 0.63 mg/dL (ref 0.50–1.03)
Globulin: 3.7 g/dL (calc) (ref 1.9–3.7)
Glucose, Bld: 97 mg/dL (ref 65–99)
Potassium: 4.4 mmol/L (ref 3.5–5.3)
Sodium: 138 mmol/L (ref 135–146)
Total Bilirubin: 0.4 mg/dL (ref 0.2–1.2)
Total Protein: 7.6 g/dL (ref 6.1–8.1)
eGFR: 107 mL/min/{1.73_m2} (ref >=60)

## 2022-03-29 LAB — LIPID PANEL
Cholesterol: 182 mg/dL
HDL: 55 mg/dL
LDL Cholesterol (Calc): 111 mg/dL — ABNORMAL HIGH
Non-HDL Cholesterol (Calc): 127 mg/dL
Total CHOL/HDL Ratio: 3.3 (calc)
Triglycerides: 75 mg/dL

## 2022-03-29 LAB — CBC WITH DIFFERENTIAL/PLATELET
Absolute Monocytes: 322 {cells}/uL (ref 200–950)
Basophils Absolute: 22 {cells}/uL (ref 0–200)
Basophils Relative: 0.7 %
Eosinophils Absolute: 251 {cells}/uL (ref 15–500)
Eosinophils Relative: 8.1 %
HCT: 37.5 % (ref 35.0–45.0)
Hemoglobin: 12.8 g/dL (ref 11.7–15.5)
Lymphs Abs: 1758 {cells}/uL (ref 850–3900)
MCH: 31.1 pg (ref 27.0–33.0)
MCHC: 34.1 g/dL (ref 32.0–36.0)
MCV: 91.2 fL (ref 80.0–100.0)
MPV: 10.2 fL (ref 7.5–12.5)
Monocytes Relative: 10.4 %
Neutro Abs: 747 {cells}/uL — ABNORMAL LOW (ref 1500–7800)
Neutrophils Relative %: 24.1 %
Platelets: 283 Thousand/uL (ref 140–400)
RBC: 4.11 Million/uL (ref 3.80–5.10)
RDW: 12.3 % (ref 11.0–15.0)
Total Lymphocyte: 56.7 %
WBC: 3.1 Thousand/uL — ABNORMAL LOW (ref 3.8–10.8)

## 2022-03-29 LAB — HIV-1 RNA QUANT-NO REFLEX-BLD
HIV 1 RNA Quant: NOT DETECTED Copies/mL
HIV-1 RNA Quant, Log: NOT DETECTED Log cps/mL

## 2022-03-29 LAB — SYPHILIS: RPR W/REFLEX TO RPR TITER AND TREPONEMAL ANTIBODIES, TRADITIONAL SCREENING AND DIAGNOSIS ALGORITHM: RPR Ser Ql: NONREACTIVE

## 2022-04-23 ENCOUNTER — Telehealth: Payer: Self-pay

## 2022-04-23 NOTE — Telephone Encounter (Signed)
Telephoned patient and completed mammogram scholarship screening process. Patient will call back and schedule appointment.

## 2022-07-23 ENCOUNTER — Other Ambulatory Visit: Payer: Self-pay

## 2022-07-23 ENCOUNTER — Ambulatory Visit (INDEPENDENT_AMBULATORY_CARE_PROVIDER_SITE_OTHER): Payer: Self-pay | Admitting: Internal Medicine

## 2022-07-23 ENCOUNTER — Encounter: Payer: Self-pay | Admitting: Internal Medicine

## 2022-07-23 VITALS — BP 124/75 | HR 75 | Temp 97.6°F | Wt 165.0 lb

## 2022-07-23 DIAGNOSIS — Z1231 Encounter for screening mammogram for malignant neoplasm of breast: Secondary | ICD-10-CM

## 2022-07-23 DIAGNOSIS — Z79899 Other long term (current) drug therapy: Secondary | ICD-10-CM

## 2022-07-23 DIAGNOSIS — B20 Human immunodeficiency virus [HIV] disease: Secondary | ICD-10-CM

## 2022-07-23 NOTE — Progress Notes (Signed)
RFV: follow up for hiv disease  Patient ID: Carolyn Williamson, female   DOB: 18-Dec-1970, 52 y.o.   MRN: [id number]  HPI Carolyn Williamson is a 52yo F french speaking, hiv disease, with well controlled  on biktarvy. She reports that her Medicines are coming at 2 different times. Has missed doses of crestor. She thinks she has overdue bill she reports but has not seen physical paper due to recent move. Has questions.  Outpatient Encounter Medications as of 07/23/2022  Medication Sig   bictegravir-emtricitabine-tenofovir AF (BIKTARVY) 50-200-25 MG TABS tablet Take 1 tablet by mouth daily.   rosuvastatin (CRESTOR) 10 MG tablet Take 1 tablet (10 mg total) by mouth daily.   hydrocortisone 1 % ointment Apply 1 application topically 2 (two) times daily. (Patient not taking: Reported on 03/26/2022)   ketotifen (ALLERGY EYE DROPS) 0.025 % ophthalmic solution Place 1 drop into both eyes 2 (two) times daily. (Patient not taking: Reported on 07/23/2022)   No facility-administered encounter medications on file as of 07/23/2022.     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   Bilateral leg edema 02/25/2014   Abdominal muscle strain 01/29/2014   Premature menopause 01/06/2014   Seasonal allergies 01/06/2014   Low back pain 11/30/2013   Abdominal pain, epigastric 10/21/2013   Myopia 10/21/2013   Dental caries 10/21/2013   Immigrant with language difficulty 09/14/2013   Refugee health examination 09/14/2013   HIV disease ([facility]) 09/14/2013   Acne vulgaris 09/14/2013     Health Maintenance Due  Topic Date Due   COVID-19 Vaccine (1) Never done   Zoster Vaccines- Shingrix (1 of 2) Never done   COLONOSCOPY (Pts 45-50yrs Insurance coverage will need to be confirmed)  Never done   MAMMOGRAM  02/21/2019   PAP SMEAR-Modifier  01/20/2021    Sochx: no smoking, no etoh. She lives at her brothers. Review of Systems  Physical Exam   BP 124/75   Pulse 75   Temp  97.6 F (36.4 C) (Oral)   Wt 165 lb (74.8 kg)   LMP 04/16/2013   SpO2 98%   BMI 28.32 kg/m   Physical Exam  Constitutional:  oriented to person, place, and time. appears well-developed and well-nourished. No distress.  HENT: [place]/AT, PERRLA, no scleral icterus Mouth/Throat: Oropharynx is clear and moist. No oropharyngeal exudate.  Cardiovascular: Normal rate, regular rhythm and normal heart sounds. Exam reveals no gallop and no friction rub.  No murmur heard.  Pulmonary/Chest: Effort normal and breath sounds normal. No respiratory distress.  has no wheezes.  Neck = supple, no nuchal rigidity Abdominal: Soft. Bowel sounds are normal.  exhibits no distension. There is no tenderness.  Lymphadenopathy: no cervical adenopathy. No axillary adenopathy Neurological: alert and oriented to person, place, and time.  Skin: Skin is warm and dry. No rash noted. No erythema.  Psychiatric: a normal mood and affect.  behavior is normal.   Lab Results  Component Value Date   CD4TCELL 36 03/26/2022   Lab Results  Component Value Date   CD4TABS 571 03/26/2022   CD4TABS 397 (L) 02/23/2019   CD4TABS 288 (L) 09/15/2018   Lab Results  Component Value Date   HIV1RNAQUANT Not Detected 03/26/2022   Lab Results  Component Value Date   HEPBSAB NEG 10/06/2013   Lab Results  Component Value Date   LABRPR NON-REACTIVE 03/26/2022    CBC Lab Results  Component Value Date   WBC 3.1 (L) 03/26/2022  RBC 4.11 03/26/2022   HGB 12.8 03/26/2022   HCT 37.5 03/26/2022   PLT 283 03/26/2022   MCV 91.2 03/26/2022   MCH 31.1 03/26/2022   MCHC 34.1 03/26/2022   RDW 12.3 03/26/2022   LYMPHSABS 1,758 03/26/2022   MONOABS 154 (L) 10/02/2016   EOSABS 251 03/26/2022    BMET Lab Results  Component Value Date   NA 138 03/26/2022   K 4.4 03/26/2022   CL 107 03/26/2022   CO2 26 03/26/2022   GLUCOSE 97 03/26/2022   BUN 12 03/26/2022   CREATININE 0.63 03/26/2022   CALCIUM 9.1 03/26/2022   GFRNONAA 104  02/23/2019   GFRAA 121 02/23/2019      Assessment and Plan  Health miantenance = needs mammo. Due to language barrier will try to arrange appt during this visit  HIv disease = we will do labs to see that she is still undetectable. Plan to refill biktarvy if needed  Long term medication management cr is stable

## 2022-07-24 LAB — T-HELPER CELL (CD4) - (RCID CLINIC ONLY)
CD4 % Helper T Cell: 34 % (ref 33–65)
CD4 T Cell Abs: 518 /uL (ref 400–1790)

## 2022-07-27 LAB — HIV-1 RNA QUANT-NO REFLEX-BLD
HIV 1 RNA Quant: NOT DETECTED Copies/mL
HIV-1 RNA Quant, Log: NOT DETECTED Log cps/mL

## 2022-10-22 ENCOUNTER — Ambulatory Visit: Payer: Self-pay

## 2022-10-22 ENCOUNTER — Encounter: Payer: Self-pay | Admitting: Internal Medicine

## 2022-10-22 ENCOUNTER — Other Ambulatory Visit: Payer: Self-pay

## 2022-10-22 ENCOUNTER — Ambulatory Visit (INDEPENDENT_AMBULATORY_CARE_PROVIDER_SITE_OTHER): Payer: Self-pay | Admitting: Internal Medicine

## 2022-10-22 VITALS — BP 115/76 | HR 66 | Temp 97.6°F | Wt 164.0 lb

## 2022-10-22 DIAGNOSIS — Z79899 Other long term (current) drug therapy: Secondary | ICD-10-CM

## 2022-10-22 DIAGNOSIS — Z1231 Encounter for screening mammogram for malignant neoplasm of breast: Secondary | ICD-10-CM

## 2022-10-22 DIAGNOSIS — B20 Human immunodeficiency virus [HIV] disease: Secondary | ICD-10-CM

## 2022-10-22 NOTE — Progress Notes (Signed)
RFV: follow up for b20  Patient ID: Carolyn Williamson, female   DOB: 1970-10-05, 52 y.o.   MRN: [id number]  HPI Carolyn Williamson is a french speaking 52yo F with hiv disease, currently on biktarvy.  Works 40hr per week. Still has fatigue associated with work.she reports that she is doing well otherwise. Has not had recent sexual relations.  Outpatient Encounter Medications as of 10/22/2022  Medication Sig   bictegravir-emtricitabine-tenofovir AF (BIKTARVY) 50-200-25 MG TABS tablet Take 1 tablet by mouth daily.   rosuvastatin (CRESTOR) 10 MG tablet Take 1 tablet (10 mg total) by mouth daily.   hydrocortisone 1 % ointment Apply 1 application topically 2 (two) times daily. (Patient not taking: Reported on 10/22/2022)   ketotifen (ALLERGY EYE DROPS) 0.025 % ophthalmic solution Place 1 drop into both eyes 2 (two) times daily. (Patient not taking: Reported on 10/22/2022)   No facility-administered encounter medications on file as of 10/22/2022.     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   Bilateral leg edema 02/25/2014   Abdominal muscle strain 01/29/2014   Premature menopause 01/06/2014   Seasonal allergies 01/06/2014   Low back pain 11/30/2013   Abdominal pain, epigastric 10/21/2013   Myopia 10/21/2013   Dental caries 10/21/2013   Immigrant with language difficulty 09/14/2013   Refugee health examination 09/14/2013   HIV disease (HCC) 09/14/2013   Acne vulgaris 09/14/2013     Health Maintenance Due  Topic Date Due   COVID-19 Vaccine (1) Never done   Zoster Vaccines- Shingrix (1 of 2) Never done   Colonoscopy  Never done   MAMMOGRAM  02/21/2019   PAP SMEAR-Modifier  01/20/2021   INFLUENZA VACCINE  10/11/2022     Review of Systems Review of Systems  Constitutional: Negative for fever, chills, diaphoresis, activity change, appetite change, fatigue and unexpected weight change.  HENT: Negative for congestion, sore throat, rhinorrhea,  sneezing, trouble swallowing and sinus pressure.  Eyes: Negative for photophobia and visual disturbance.  Respiratory: Negative for cough, chest tightness, shortness of breath, wheezing and stridor.  Cardiovascular: Negative for chest pain, palpitations and leg swelling.  Gastrointestinal: Negative for nausea, vomiting, abdominal pain, diarrhea, constipation, blood in stool, abdominal distention and anal bleeding.  Genitourinary: Negative for dysuria, hematuria, flank pain and difficulty urinating.  Musculoskeletal: Negative for myalgias, back pain, joint swelling, arthralgias and gait problem.  Skin: Negative for color change, pallor, rash and wound.  Neurological: Negative for dizziness, tremors, weakness and light-headedness.  Hematological: Negative for adenopathy. Does not bruise/bleed easily.  Psychiatric/Behavioral: Negative for behavioral problems, confusion, sleep disturbance, dysphoric mood, decreased concentration and agitation.   Physical Exam   Wt 164 lb (74.4 kg)   LMP 04/16/2013   BMI 28.15 kg/m   Physical Exam  Constitutional:  oriented to person, place, and time. appears well-developed and well-nourished. No distress.  HENT: [place]/AT, PERRLA, no scleral icterus Mouth/Throat: Oropharynx is clear and moist. No oropharyngeal exudate.  Cardiovascular: Normal rate, regular rhythm and normal heart sounds. Exam reveals no gallop and no friction rub.  No murmur heard.  Pulmonary/Chest: Effort normal and breath sounds normal. No respiratory distress.  has no wheezes.  Neck = supple, no nuchal rigidity Abdominal: Soft. Bowel sounds are normal.  exhibits no distension. There is no tenderness.  Lymphadenopathy: no cervical adenopathy. No axillary adenopathy Neurological: alert and oriented to person, place, and time.  Skin: Skin is warm and dry. No rash noted. No erythema.  Psychiatric: a normal  mood and affect.  behavior is normal.   Lab Results  Component Value Date   CD4TCELL  34 07/23/2022   Lab Results  Component Value Date   CD4TABS 518 07/23/2022   CD4TABS 571 03/26/2022   CD4TABS 397 (L) 02/23/2019   Lab Results  Component Value Date   HIV1RNAQUANT Not Detected 07/23/2022   Lab Results  Component Value Date   HEPBSAB NEG 10/06/2013   Lab Results  Component Value Date   LABRPR NON-REACTIVE 03/26/2022    CBC Lab Results  Component Value Date   WBC 3.1 (L) 03/26/2022   RBC 4.11 03/26/2022   HGB 12.8 03/26/2022   HCT 37.5 03/26/2022   PLT 283 03/26/2022   MCV 91.2 03/26/2022   MCH 31.1 03/26/2022   MCHC 34.1 03/26/2022   RDW 12.3 03/26/2022   LYMPHSABS 1,758 03/26/2022   MONOABS 154 (L) 10/02/2016   EOSABS 251 03/26/2022    BMET Lab Results  Component Value Date   NA 138 03/26/2022   K 4.4 03/26/2022   CL 107 03/26/2022   CO2 26 03/26/2022   GLUCOSE 97 03/26/2022   BUN 12 03/26/2022   CREATININE 0.63 03/26/2022   CALCIUM 9.1 03/26/2022   GFRNONAA 104 02/23/2019   GFRAA 121 02/23/2019      Assessment and Plan Hiv disease = continue on biktarvy; well-controlled, but plan on checking labs today  Long term medication management = check cr today  Health miaintenance = Pap smear needed; mammogram

## 2022-11-05 ENCOUNTER — Other Ambulatory Visit: Payer: Self-pay

## 2022-11-05 ENCOUNTER — Encounter: Payer: Self-pay | Admitting: Infectious Diseases

## 2022-11-05 ENCOUNTER — Ambulatory Visit (INDEPENDENT_AMBULATORY_CARE_PROVIDER_SITE_OTHER): Payer: Self-pay | Admitting: Infectious Diseases

## 2022-11-05 VITALS — BP 119/79 | HR 60 | Wt 165.0 lb

## 2022-11-05 DIAGNOSIS — Z113 Encounter for screening for infections with a predominantly sexual mode of transmission: Secondary | ICD-10-CM

## 2022-11-05 DIAGNOSIS — Z124 Encounter for screening for malignant neoplasm of cervix: Secondary | ICD-10-CM

## 2022-11-05 DIAGNOSIS — B20 Human immunodeficiency virus [HIV] disease: Secondary | ICD-10-CM

## 2022-11-05 NOTE — Assessment & Plan Note (Signed)
Very painful exam for her. She has extremely friable vaginal tissue with speculum exam. FDA with new approval for HPV self collection method for cervical cancer screening - she would be a perfect candidate for this given her significant discomfort with pelvic exam.   Cervical brushing collected for cytology and HPV. GC/CT/TV swab also sent for screening.  Discussed recommended screening interval for women living with HIV disease meant to be lifelong and at an interval of Q1-3 years pending results. Acceptable to space out Q3y with 3 consecutively normal exams. Further recommendations for Carolyn Williamson to follow today's results.  Results will be communicated to the patient via telephone call.

## 2022-11-05 NOTE — Assessment & Plan Note (Signed)
FU scheduled with Dr. Drue Second 02/18/2023

## 2022-11-05 NOTE — Progress Notes (Signed)
      Subjective :    Carolyn Williamson is a 52 y.o. female here for an annual pelvic exam and pap smear.   Review of Systems: Current GYN complaints or concerns: significant worry and concern over the pelvic exam. Very painful for her. Last exam was in 2019.  She does not have any sexual partners. No vaginal penetration at all.  No pelvic pain normally.    Past Medical History:  Diagnosis Date   Fibroids    HIV (human immunodeficiency virus infection) (HCC)     Gynecologic History: No obstetric history on file.  Patient's last menstrual period was 04/16/2013. Contraception: post menopausal  Last Pap: 2019. Results were: normal Last Mammogram: 2019. Results were: normal    Objective :   Physical Exam - chaperone present  Constitutional: Well developed, well nourished, no acute distress. She is alert and oriented x3.  Pelvic: External genitalia is normal in appearance. The vagina is atrophic and very friable, bleeds with retracting the labia. Small speculum used. The cervix is bulbous and easily visualized. No CMT, normal expected cervical mucus present. Bimanual exam reveals uterus that is felt to be normal size, shape, and contour. No adnexal masses or tenderness noted. Breasts: symmetrical in contour, shape and texture. No palpable masses/nodules. No nipple discharge.  Psych: She has a normal mood and affect.      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 Drue Second 02/18/2023       Screening for cervical cancer - Primary    Very painful exam for her. She has extremely friable vaginal tissue with speculum exam. FDA with new approval for HPV self collection method for cervical cancer screening - she would be a perfect candidate for this given her significant discomfort with pelvic exam.   Cervical brushing collected for cytology and HPV. GC/CT/TV swab also sent for screening.  Discussed recommended screening interval for women living with HIV disease meant to be lifelong and at an interval of Q1-3 years pending results. Acceptable to space out Q3y with 3 consecutively normal exams. Further recommendations for Martina Sinner to follow today's results.  Results will be communicated to the patient via telephone call.        Relevant Orders   Cytology - PAP( [place])   Other Visit Diagnoses     Routine screening for STI (sexually transmitted infection)       Relevant Orders   Cervicovaginal ancillary only( [place])        Rexene Alberts, MSN, NP-C [facility] Office: [phone] Pager: [phone]  11/05/22 11:06 AM

## 2022-11-07 LAB — CYTOLOGY - PAP
Comment: NEGATIVE
Diagnosis: NEGATIVE
High risk HPV: NEGATIVE

## 2022-11-07 LAB — CERVICOVAGINAL ANCILLARY ONLY
Chlamydia: NEGATIVE
Comment: NEGATIVE
Comment: NEGATIVE
Comment: NORMAL
Neisseria Gonorrhea: NEGATIVE
Trichomonas: NEGATIVE

## 2022-11-13 ENCOUNTER — Telehealth: Payer: Self-pay

## 2022-11-13 NOTE — Telephone Encounter (Signed)
-----   Message from [place] sent at 11/10/2022  9:52 AM EDT ----- Please call her to let her know her Pap smear is negative for any pre cancerous changes. Would recommend to repeat in 3 years.  I am hopeful we can get an easier screening test for her - the FDA just approved a swab only where I won't have to do a pelvic exam for her. More to come.

## 2022-11-13 NOTE — Telephone Encounter (Signed)
Spoke with Carolyn Williamson, relayed that pap smear returned normal without any pre-cancerous changes and that Carolyn Williamson recommends repeating this in three years. She is asking about scheduling a mammogram, offered to provide her with phone number to the [facility], she states she will look them up and call us if she needs further assistance.   Carolyn Ano, RN

## 2022-12-11 ENCOUNTER — Other Ambulatory Visit: Payer: Self-pay | Admitting: Internal Medicine

## 2022-12-21 ENCOUNTER — Other Ambulatory Visit: Payer: Self-pay | Admitting: Internal Medicine

## 2023-02-18 ENCOUNTER — Encounter: Payer: Self-pay | Admitting: Internal Medicine

## 2023-02-18 ENCOUNTER — Ambulatory Visit (INDEPENDENT_AMBULATORY_CARE_PROVIDER_SITE_OTHER): Payer: Self-pay | Admitting: Internal Medicine

## 2023-02-18 ENCOUNTER — Other Ambulatory Visit: Payer: Self-pay | Admitting: Internal Medicine

## 2023-02-18 ENCOUNTER — Other Ambulatory Visit: Payer: Self-pay

## 2023-02-18 VITALS — BP 118/78 | HR 64 | Resp 16 | Ht 64.0 in | Wt 164.2 lb

## 2023-02-18 DIAGNOSIS — J302 Other seasonal allergic rhinitis: Secondary | ICD-10-CM

## 2023-02-18 DIAGNOSIS — Z79899 Other long term (current) drug therapy: Secondary | ICD-10-CM

## 2023-02-18 DIAGNOSIS — B9789 Other viral agents as the cause of diseases classified elsewhere: Secondary | ICD-10-CM

## 2023-02-18 DIAGNOSIS — J988 Other specified respiratory disorders: Secondary | ICD-10-CM

## 2023-02-18 DIAGNOSIS — B20 Human immunodeficiency virus [HIV] disease: Secondary | ICD-10-CM

## 2023-02-18 DIAGNOSIS — Z1231 Encounter for screening mammogram for malignant neoplasm of breast: Secondary | ICD-10-CM

## 2023-02-18 DIAGNOSIS — Z23 Encounter for immunization: Secondary | ICD-10-CM

## 2023-02-18 MED ORDER — CETIRIZINE HCL 10 MG PO TABS: 10.0000 mg | ORAL_TABLET | Freq: Every day | ORAL | 3 refills | Status: AC

## 2023-02-18 MED ORDER — BIKTARVY 50-200-25 MG PO TABS: 1.0000 | ORAL_TABLET | Freq: Every day | ORAL | 11 refills | Status: DC

## 2023-02-18 NOTE — Progress Notes (Signed)
Patient ID: Carolyn Williamson, female   DOB: 1971-01-03, 52 y.o.   MRN: [id number]  HPI Carolyn Williamson is a 52yo F french speaking [place] female with well controlled hiv disease, currently has excellent adherence on bitkarvy.  Had pap smear. Redo in 3 years  Needs mammo and vaccines  Wears reading glasses  Having difficulty getting mammo appointment due to language barrier, prefers Monday am  Recently had cold -- wheezing on exam  Outpatient Encounter Medications as of 02/18/2023  Medication Sig   bictegravir-emtricitabine-tenofovir AF (BIKTARVY) 50-200-25 MG TABS tablet Take 1 tablet by mouth daily.   hydrocortisone 1 % ointment Apply 1 application topically 2 (two) times daily.   ketotifen (ALLERGY EYE DROPS) 0.025 % ophthalmic solution Place 1 drop into both eyes 2 (two) times daily.   rosuvastatin (CRESTOR) 10 MG tablet Take 1 tablet (10 mg total) by mouth daily.   No facility-administered encounter medications on file as of 02/18/2023.     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   Bilateral leg edema 02/25/2014   Abdominal muscle strain 01/29/2014   Premature menopause 01/06/2014   Seasonal allergies 01/06/2014   Low back pain 11/30/2013   Abdominal pain, epigastric 10/21/2013   Myopia 10/21/2013   Dental caries 10/21/2013   Immigrant with language difficulty 09/14/2013   Refugee health examination 09/14/2013   HIV disease (HCC) 09/14/2013   Acne vulgaris 09/14/2013     Health Maintenance Due  Topic Date Due   Zoster Vaccines- Shingrix (1 of 2) Never done   Colonoscopy  Never done   MAMMOGRAM  02/21/2019     Review of Systems 12 point ros is negative except she reports cough but no fever Physical Exam   BP 118/78   Pulse 64   Resp 16   Ht 5\' 4"  (1.626 m)   Wt 164 lb 3.2 oz (74.5 kg)   LMP 04/16/2013   BMI 28.18 kg/m   Physical Exam  Constitutional:   oriented to person, place, and time. appears well-developed and well-nourished. No distress.  HENT: [place]/AT, PERRLA, no scleral icterus Mouth/Throat: Oropharynx is clear and moist. No oropharyngeal exudate.  Cardiovascular: Normal rate, regular rhythm and normal heart sounds. Exam reveals no gallop and no friction rub.  No murmur heard.  Pulmonary/Chest: Effort normal and breath sounds normal. No respiratory distress.  + rhonchi and +wheezes.  Neck = supple, no nuchal rigidity Abdominal: Soft. Bowel sounds are normal.  exhibits no distension. There is no tenderness.  Lymphadenopathy: no cervical adenopathy. No axillary adenopathy Neurological: alert and oriented to person, place, and time.  Skin: Skin is warm and dry. No rash noted. No erythema.  Psychiatric: a normal mood and affect.  behavior is normal.   Lab Results  Component Value Date   CD4TCELL 34 07/23/2022   Lab Results  Component Value Date   CD4TABS 518 07/23/2022   CD4TABS 571 03/26/2022   CD4TABS 397 (L) 02/23/2019   Lab Results  Component Value Date   HIV1RNAQUANT Not Detected 07/23/2022   Lab Results  Component Value Date   HEPBSAB NEG 10/06/2013   Lab Results  Component Value Date   LABRPR NON-REACTIVE 03/26/2022    CBC Lab Results  Component Value Date   WBC 3.1 (L) 03/26/2022   RBC 4.11 03/26/2022   HGB 12.8 03/26/2022   HCT 37.5 03/26/2022   PLT 283 03/26/2022   MCV  91.2 03/26/2022   MCH 31.1 03/26/2022   MCHC 34.1 03/26/2022   RDW 12.3 03/26/2022   LYMPHSABS 1,758 03/26/2022   MONOABS 154 (L) 10/02/2016   EOSABS 251 03/26/2022    BMET Lab Results  Component Value Date   NA 138 03/26/2022   K 4.4 03/26/2022   CL 107 03/26/2022   CO2 26 03/26/2022   GLUCOSE 97 03/26/2022   BUN 12 03/26/2022   CREATININE 0.63 03/26/2022   CALCIUM 9.1 03/26/2022   GFRNONAA 104 02/23/2019   GFRAA 121 02/23/2019      Assessment and Plan Seasonal allergies = Pataday eye drops and zyrtec  Viral  respiratory illness = continue with supportive care  Hiv disease=  will plan on checking labs = Hiv VL and CD 4 count  Long term medication management = will check cr to see still abe baseline  Health maintenance = Pneumovax 20

## 2023-02-18 NOTE — Progress Notes (Signed)
Mammogram scheduled. Appointment information provided to patient.   Sandie Ano, RN

## 2023-02-19 LAB — T-HELPER CELL (CD4) - (RCID CLINIC ONLY)
CD4 % Helper T Cell: 35 % (ref 33–65)
CD4 T Cell Abs: 547 /uL (ref 400–1790)

## 2023-02-20 LAB — CBC WITH DIFFERENTIAL/PLATELET
Absolute Lymphocytes: 1697 {cells}/uL (ref 850–3900)
Absolute Monocytes: 369 {cells}/uL (ref 200–950)
Basophils Absolute: 41 {cells}/uL (ref 0–200)
Basophils Relative: 1 %
Eosinophils Absolute: 775 {cells}/uL — ABNORMAL HIGH (ref 15–500)
Eosinophils Relative: 18.9 %
HCT: 39.2 % (ref 35.0–45.0)
Hemoglobin: 12.7 g/dL (ref 11.7–15.5)
MCH: 29.8 pg (ref 27.0–33.0)
MCHC: 32.4 g/dL (ref 32.0–36.0)
MCV: 92 fL (ref 80.0–100.0)
MPV: 10.4 fL (ref 7.5–12.5)
Monocytes Relative: 9 %
Neutro Abs: 1218 {cells}/uL — ABNORMAL LOW (ref 1500–7800)
Neutrophils Relative %: 29.7 %
Platelets: 292 10*3/uL (ref 140–400)
RBC: 4.26 10*6/uL (ref 3.80–5.10)
RDW: 12.6 % (ref 11.0–15.0)
Total Lymphocyte: 41.4 %
WBC: 4.1 10*3/uL (ref 3.8–10.8)

## 2023-02-20 LAB — COMPLETE METABOLIC PANEL WITH GFR
AG Ratio: 1.1 (calc) (ref 1.0–2.5)
ALT: 9 U/L (ref 6–29)
AST: 19 U/L (ref 10–35)
Albumin: 3.8 g/dL (ref 3.6–5.1)
Alkaline phosphatase (APISO): 89 U/L (ref 37–153)
BUN: 12 mg/dL (ref 7–25)
CO2: 22 mmol/L (ref 20–32)
Calcium: 8.5 mg/dL — ABNORMAL LOW (ref 8.6–10.4)
Chloride: 107 mmol/L (ref 98–110)
Creat: 0.68 mg/dL (ref 0.50–1.03)
Globulin: 3.5 g/dL (ref 1.9–3.7)
Glucose, Bld: 93 mg/dL (ref 65–99)
Potassium: 4.5 mmol/L (ref 3.5–5.3)
Sodium: 137 mmol/L (ref 135–146)
Total Bilirubin: 0.4 mg/dL (ref 0.2–1.2)
Total Protein: 7.3 g/dL (ref 6.1–8.1)
eGFR: 105 mL/min/{1.73_m2} (ref >=60)

## 2023-02-20 LAB — HIV-1 RNA QUANT-NO REFLEX-BLD
HIV 1 RNA Quant: NOT DETECTED {copies}/mL
HIV-1 RNA Quant, Log: NOT DETECTED {Log_copies}/mL

## 2023-02-20 LAB — RPR: RPR Ser Ql: NONREACTIVE

## 2023-03-25 ENCOUNTER — Other Ambulatory Visit: Payer: Self-pay | Admitting: Internal Medicine

## 2023-03-25 ENCOUNTER — Ambulatory Visit
Admission: RE | Admit: 2023-03-25 | Discharge: 2023-03-25 | Disposition: A | Discharge status: Home / Self Care | Payer: No Typology Code available for payment source | Source: Ambulatory Visit | Attending: Internal Medicine | Admitting: Internal Medicine

## 2023-03-25 DIAGNOSIS — Z1231 Encounter for screening mammogram for malignant neoplasm of breast: Secondary | ICD-10-CM

## 2023-04-18 ENCOUNTER — Other Ambulatory Visit: Payer: Self-pay | Admitting: Internal Medicine

## 2023-07-22 NOTE — Progress Notes (Signed)
 The ASCVD Risk score (Arnett DK, et al., 2019) failed to calculate for the following reasons:   Unable to determine if patient is Non-Hispanic African American  Carolyn Williamson, Scientist, research (physical sciences), Charity fundraiser

## 2023-08-19 ENCOUNTER — Ambulatory Visit: Payer: Self-pay | Admitting: Internal Medicine

## 2023-08-26 ENCOUNTER — Other Ambulatory Visit: Payer: Self-pay

## 2023-08-26 ENCOUNTER — Ambulatory Visit: Payer: Self-pay | Admitting: Internal Medicine

## 2023-08-26 ENCOUNTER — Encounter: Payer: Self-pay | Admitting: Internal Medicine

## 2023-08-26 VITALS — BP 106/69 | HR 68 | Resp 16 | Ht 64.0 in | Wt 149.0 lb

## 2023-08-26 DIAGNOSIS — Z79899 Other long term (current) drug therapy: Secondary | ICD-10-CM

## 2023-08-26 DIAGNOSIS — B20 Human immunodeficiency virus [HIV] disease: Secondary | ICD-10-CM

## 2023-08-26 DIAGNOSIS — Z113 Encounter for screening for infections with a predominantly sexual mode of transmission: Secondary | ICD-10-CM

## 2023-08-26 NOTE — Progress Notes (Signed)
 RFV: follow up for hiv disease  Patient ID: Carolyn Williamson, female   DOB: [birth year], 53 y.o.   MRN: [id number]  HPI  Carolyn Williamson is a53yo F french speaking from [place] with HIV disease, on biktarvy ,  here for 6 month labs  Had 1 day of nausea and vomiting and now improved -- she suspects from spoiled food.  She reports that she hasn't been taking her ART. She hasn't felt that it has helped her and she has not noticed anything different since stopping her medications. She denies thrush, fatigue, weight loss, fevers or chills. Outpatient Encounter Medications as of 08/26/2023  Medication Sig   bictegravir-emtricitabine -tenofovir  AF (BIKTARVY ) 50-200-25 MG TABS tablet Take 1 tablet by mouth daily.   cetirizine  (ZYRTEC  ALLERGY) 10 MG tablet Take 1 tablet (10 mg total) by mouth daily.   hydrocortisone  1 % ointment Apply 1 application topically 2 (two) times daily.   ketotifen  (ALLERGY EYE DROPS) 0.025 % ophthalmic solution Place 1 drop into both eyes 2 (two) times daily.   rosuvastatin  (CRESTOR ) 10 MG tablet TAKE 1 TABLET(10 MG) BY MOUTH DAILY   No facility-administered encounter medications on file as of 08/26/2023.     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   Bilateral leg edema 02/25/2014   Abdominal muscle strain 01/29/2014   Premature menopause 01/06/2014   Seasonal allergies 01/06/2014   Low back pain 11/30/2013   Abdominal pain, epigastric 10/21/2013   Myopia 10/21/2013   Dental caries 10/21/2013   Immigrant with language difficulty 09/14/2013   Refugee health examination 09/14/2013   HIV disease (HCC) 09/14/2013   Acne vulgaris 09/14/2013     Health Maintenance Due  Topic Date Due   COVID-19 Vaccine (1) Never done   Zoster Vaccines- Shingrix (1 of 2) Never done   Colonoscopy  Never done     Review of Systems 12 point ros is negative Physical Exam   Resp 16   Ht  5' 4 (1.626 m)   Wt 149 lb 0.5 oz (67.6 kg)   LMP 04/16/2013   BMI 25.58 kg/m   Physical Exam  Constitutional:  oriented to person, place, and time. appears well-developed and well-nourished. No distress.  HENT: [place]/AT, PERRLA, no scleral icterus Mouth/Throat: Oropharynx is clear and moist. No oropharyngeal exudate.  Cardiovascular: Normal rate, regular rhythm and normal heart sounds. Exam reveals no gallop and no friction rub.  No murmur heard.  Pulmonary/Chest: Effort normal and breath sounds normal. No respiratory distress.  has no wheezes.  Neck = supple, no nuchal rigidity Lymphadenopathy: no cervical adenopathy. No axillary adenopathy Neurological: alert and oriented to person, place, and time.  Skin: Skin is warm and dry. No rash noted. No erythema.  Psychiatric: a normal mood and affect.  behavior is normal.   Lab Results  Component Value Date   CD4TCELL 35 02/18/2023   Lab Results  Component Value Date   CD4TABS 547 02/18/2023   CD4TABS 518 07/23/2022   CD4TABS 571 03/26/2022   Lab Results  Component Value Date   HIV1RNAQUANT Not Detected 02/18/2023   Lab Results  Component Value Date   HEPBSAB NEG 10/06/2013   Lab Results  Component Value Date   LABRPR NON-REACTIVE 02/18/2023    CBC Lab Results  Component Value Date   WBC 4.1 02/18/2023   RBC 4.26 02/18/2023   HGB 12.7 02/18/2023   HCT 39.2 02/18/2023  PLT 292 02/18/2023   MCV 92.0 02/18/2023   MCH 29.8 02/18/2023   MCHC 32.4 02/18/2023   RDW 12.6 02/18/2023   LYMPHSABS 1,758 03/26/2022   MONOABS 154 (L) 10/02/2016   EOSABS 775 (H) 02/18/2023    BMET Lab Results  Component Value Date   NA 137 02/18/2023   K 4.5 02/18/2023   CL 107 02/18/2023   CO2 22 02/18/2023   GLUCOSE 93 02/18/2023   BUN 12 02/18/2023   CREATININE 0.68 02/18/2023   CALCIUM  8.5 (L) 02/18/2023   GFRNONAA 104 02/23/2019   GFRAA 121 02/23/2019      Assessment and Plan HIV disease= will check cd 4 count, and HIV VL.  Anticipate that she may need oi proph, presently discussed reasons why she needs to be back on HIV medication.  Long term medication management = will check cr.  See back in 4 wk to get her back onto her treatment.  Addendum = still start on bactrim  ds daily for pjp proph  I have personally spent 50 minutes involved in face-to-face and non-face-to-face activities for this patient on the day of the visit. Professional time spent includes the following activities: Preparing to see the patient (review of tests), Performing a medically appropriate examination and/or evaluation , Ordering medications/tests/procedures, referring and communicating with other health care professionals, Documenting clinical information in the EMR, Independently interpreting results (not separately reported), Communicating results and Counseling and educating the patient.

## 2023-08-27 LAB — C. TRACHOMATIS/N. GONORRHOEAE RNA
C. trachomatis RNA, TMA: NOT DETECTED
N. gonorrhoeae RNA, TMA: NOT DETECTED

## 2023-08-28 LAB — COMPLETE METABOLIC PANEL WITHOUT GFR
AG Ratio: 0.9 (calc) — ABNORMAL LOW (ref 1.0–2.5)
ALT: 197 U/L — ABNORMAL HIGH (ref 6–29)
AST: 944 U/L — ABNORMAL HIGH (ref 10–35)
Albumin: 3.9 g/dL (ref 3.6–5.1)
Alkaline phosphatase (APISO): 219 U/L — ABNORMAL HIGH (ref 37–153)
BUN: 8 mg/dL (ref 7–25)
CO2: 24 mmol/L (ref 20–32)
Calcium: 9.1 mg/dL (ref 8.6–10.4)
Chloride: 104 mmol/L (ref 98–110)
Creat: 0.51 mg/dL (ref 0.50–1.03)
Globulin: 4.3 g/dL — ABNORMAL HIGH (ref 1.9–3.7)
Glucose, Bld: 96 mg/dL (ref 65–99)
Potassium: 4.3 mmol/L (ref 3.5–5.3)
Sodium: 135 mmol/L (ref 135–146)
Total Bilirubin: 1.7 mg/dL — ABNORMAL HIGH (ref 0.2–1.2)
Total Protein: 8.2 g/dL — ABNORMAL HIGH (ref 6.1–8.1)

## 2023-08-28 LAB — RPR: RPR Ser Ql: NONREACTIVE

## 2023-08-28 LAB — CBC WITH DIFFERENTIAL/PLATELET
Absolute Lymphocytes: 1219 {cells}/uL (ref 850–3900)
Absolute Monocytes: 239 {cells}/uL (ref 200–950)
Basophils Absolute: 21 {cells}/uL (ref 0–200)
Basophils Relative: 0.8 %
Eosinophils Absolute: 169 {cells}/uL (ref 15–500)
Eosinophils Relative: 6.5 %
HCT: 41.4 % (ref 35.0–45.0)
Hemoglobin: 13.5 g/dL (ref 11.7–15.5)
MCH: 29.4 pg (ref 27.0–33.0)
MCHC: 32.6 g/dL (ref 32.0–36.0)
MCV: 90.2 fL (ref 80.0–100.0)
MPV: 10.3 fL (ref 7.5–12.5)
Monocytes Relative: 9.2 %
Neutro Abs: 952 {cells}/uL — ABNORMAL LOW (ref 1500–7800)
Neutrophils Relative %: 36.6 %
Platelets: 221 10*3/uL (ref 140–400)
RBC: 4.59 10*6/uL (ref 3.80–5.10)
RDW: 14.3 % (ref 11.0–15.0)
Total Lymphocyte: 46.9 %
WBC: 2.6 10*3/uL — ABNORMAL LOW (ref 3.8–10.8)

## 2023-08-28 LAB — T-HELPER CELLS (CD4) COUNT (NOT AT ARMC)
Absolute CD4: 141 {cells}/uL — ABNORMAL LOW (ref 490–1740)
CD4 T Helper %: 10 % — ABNORMAL LOW (ref 30–61)
Total lymphocyte count: 1345 {cells}/uL (ref 850–3900)

## 2023-08-28 LAB — HIV-1 RNA QUANT-NO REFLEX-BLD
HIV 1 RNA Quant: 22500 {copies}/mL — ABNORMAL HIGH
HIV-1 RNA Quant, Log: 4.35 {Log_copies}/mL — ABNORMAL HIGH

## 2023-08-28 LAB — LIPID PANEL
Cholesterol: 174 mg/dL (ref <200)
HDL: 49 mg/dL — ABNORMAL LOW (ref >=50)
LDL Cholesterol (Calc): 110 mg/dL — ABNORMAL HIGH
Non-HDL Cholesterol (Calc): 125 mg/dL (ref <130)
Total CHOL/HDL Ratio: 3.6 (calc) (ref <5.0)
Triglycerides: 63 mg/dL (ref <150)

## 2023-09-09 MED ORDER — SULFAMETHOXAZOLE-TRIMETHOPRIM 800-160 MG PO TABS: 1.0000 | ORAL_TABLET | Freq: Every day | ORAL | 3 refills | Status: AC

## 2023-10-07 ENCOUNTER — Telehealth: Payer: Self-pay

## 2023-10-07 NOTE — Telephone Encounter (Signed)
 Received fax from [facility] stating they have been unable to get in touch with patient.   Called patient via [employer] [phone]), no answer. Interpreter left message stating the pharmacy has been trying to contact her and left their phone number.   Lajoy Vanamburg, BSN, RN

## 2023-11-04 ENCOUNTER — Encounter: Payer: Self-pay | Admitting: Internal Medicine

## 2023-11-04 ENCOUNTER — Other Ambulatory Visit: Payer: Self-pay

## 2023-11-04 ENCOUNTER — Ambulatory Visit: Payer: Self-pay | Admitting: Internal Medicine

## 2023-11-04 ENCOUNTER — Ambulatory Visit: Payer: Self-pay

## 2023-11-04 VITALS — BP 129/78 | HR 66 | Temp 97.2°F

## 2023-11-04 DIAGNOSIS — Z79899 Other long term (current) drug therapy: Secondary | ICD-10-CM

## 2023-11-04 DIAGNOSIS — Z789 Other specified health status: Secondary | ICD-10-CM

## 2023-11-04 DIAGNOSIS — B20 Human immunodeficiency virus [HIV] disease: Secondary | ICD-10-CM

## 2023-11-04 NOTE — Progress Notes (Signed)
 RFV: hiv disease  Patient ID: Carolyn Williamson, female   DOB: July 03, 1970, 53 y.o.   MRN: [id number]  HPI Carolyn Williamson is a 53yo [place] Speaking F with advanced HIV disease, since our last appointment she has Restarted biktarvy  taking it daily, not missing doses. She has tolerated without side effects. She reports after a long day of work, she occasionally gets charley horse cramping of calf at bedtime  Outpatient Encounter Medications as of 11/04/2023  Medication Sig   bictegravir-emtricitabine -tenofovir  AF (BIKTARVY ) 50-200-25 MG TABS tablet Take 1 tablet by mouth daily.   hydrocortisone  1 % ointment Apply 1 application topically 2 (two) times daily.   ketotifen  (ALLERGY EYE DROPS) 0.025 % ophthalmic solution Place 1 drop into both eyes 2 (two) times daily.   cetirizine  (ZYRTEC  ALLERGY) 10 MG tablet Take 1 tablet (10 mg total) by mouth daily. (Patient not taking: Reported on 11/04/2023)   rosuvastatin  (CRESTOR ) 10 MG tablet TAKE 1 TABLET(10 MG) BY MOUTH DAILY   sulfamethoxazole -trimethoprim  (BACTRIM  DS) 800-160 MG tablet Take 1 tablet by mouth daily. (Patient not taking: Reported on 11/04/2023)   No facility-administered encounter medications on file as of 11/04/2023.     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   Bilateral leg edema 02/25/2014   Abdominal muscle strain 01/29/2014   Premature menopause 01/06/2014   Seasonal allergies 01/06/2014   Low back pain 11/30/2013   Abdominal pain, epigastric 10/21/2013   Myopia 10/21/2013   Dental caries 10/21/2013   Immigrant with language difficulty 09/14/2013   Refugee health examination 09/14/2013   HIV disease (HCC) 09/14/2013   Acne vulgaris 09/14/2013     Health Maintenance Due  Topic Date Due   COVID-19 Vaccine (1) Never done   Zoster Vaccines- Shingrix (1 of 2) Never done   Hepatitis B Vaccines 19-59 Average Risk (3 of 3 - 19+ 3-dose  series) 05/06/2014   Colonoscopy  Never done   INFLUENZA VACCINE  10/11/2023     Review of Systems Review of Systems  Constitutional: Negative for fever, chills, diaphoresis, activity change, appetite change, fatigue and unexpected weight change.  HENT: Negative for congestion, sore throat, rhinorrhea, sneezing, trouble swallowing and sinus pressure.  Eyes: Negative for photophobia and visual disturbance.  Respiratory: Negative for cough, chest tightness, shortness of breath, wheezing and stridor.  Cardiovascular: Negative for chest pain, palpitations and leg swelling.  Gastrointestinal: Negative for nausea, vomiting, abdominal pain, diarrhea, constipation, blood in stool, abdominal distention and anal bleeding.  Genitourinary: Negative for dysuria, hematuria, flank pain and difficulty urinating.  Musculoskeletal: Negative for myalgias, back pain, joint swelling, arthralgias and gait problem.  Skin: Negative for color change, pallor, rash and wound.  Neurological: Negative for dizziness, tremors, weakness and light-headedness.  Hematological: Negative for adenopathy. Does not bruise/bleed easily.  Psychiatric/Behavioral: Negative for behavioral problems, confusion, sleep disturbance, dysphoric mood, decreased concentration and agitation.   Physical Exam   BP 129/78   Pulse 66   Temp (!) 97.2 F (36.2 C) (Temporal)   LMP 04/16/2013   SpO2 97%   Physical Exam  Constitutional:  oriented to person, place, and time. appears well-developed and well-nourished. No distress.  HENT: [place]/AT, PERRLA, no scleral icterus Mouth/Throat: Oropharynx is clear and moist. No oropharyngeal exudate. No thrush Cardiovascular: Normal rate, regular rhythm and normal heart sounds. Exam reveals no gallop and no friction rub.  No murmur heard.  Pulmonary/Chest: Effort normal and breath sounds  normal. No respiratory distress.  has no wheezes.  Neck = supple, no nuchal rigidity Abdominal: Soft. Bowel sounds are  normal.  exhibits no distension. There is no tenderness.  Lymphadenopathy: no cervical adenopathy. No axillary adenopathy Zku:[id number] edema Neurological: alert and oriented to person, place, and time.  Skin: Skin is warm and dry. No rash noted. No erythema.  Psychiatric: a normal mood and affect.  behavior is normal.     Lab Results  Component Value Date   CD4TCELL 10 (L) 08/26/2023   Lab Results  Component Value Date   CD4TABS 547 02/18/2023   CD4TABS 518 07/23/2022   CD4TABS 571 03/26/2022   Lab Results  Component Value Date   HIV1RNAQUANT 22,500 (H) 08/26/2023   Lab Results  Component Value Date   HEPBSAB NEG 10/06/2013   Lab Results  Component Value Date   LABRPR NON-REACTIVE 08/26/2023    CBC Lab Results  Component Value Date   WBC 2.6 (L) 08/26/2023   RBC 4.59 08/26/2023   HGB 13.5 08/26/2023   HCT 41.4 08/26/2023   PLT 221 08/26/2023   MCV 90.2 08/26/2023   MCH 29.4 08/26/2023   MCHC 32.6 08/26/2023   RDW 14.3 08/26/2023   LYMPHSABS 1,758 03/26/2022   MONOABS 154 (L) 10/02/2016   EOSABS 169 08/26/2023    BMET Lab Results  Component Value Date   NA 135 08/26/2023   K 4.3 08/26/2023   CL 104 08/26/2023   CO2 24 08/26/2023   GLUCOSE 96 08/26/2023   BUN 8 08/26/2023   CREATININE 0.51 08/26/2023   CALCIUM  9.1 08/26/2023   GFRNONAA 104 02/23/2019   GFRAA 121 02/23/2019    Lab Results  Component Value Date   CD4TCELL 19 (L) 11/04/2023   CD4TABS 183 (L) 11/04/2023   Lab Results  Component Value Date   HIV1RNAQUANT NOT DETECTED 11/04/2023     Assessment and Plan HIV disease= continue with taking biktarvy  daily. Will get labs to see that she is undetectable  Long term medication management = will check cbc and cmp to see that is at baseline  Medication adherence counseling  2 month return to see that she is doing well with medication

## 2023-11-06 LAB — CBC WITH DIFFERENTIAL/PLATELET
Absolute Lymphocytes: 957 {cells}/uL (ref 850–3900)
Absolute Monocytes: 260 {cells}/uL (ref 200–950)
Basophils Absolute: 30 {cells}/uL (ref 0–200)
Basophils Relative: 1.3 %
Eosinophils Absolute: 239 {cells}/uL (ref 15–500)
Eosinophils Relative: 10.4 %
HCT: 39.1 % (ref 35.0–45.0)
Hemoglobin: 13 g/dL (ref 11.7–15.5)
MCH: 30.8 pg (ref 27.0–33.0)
MCHC: 33.2 g/dL (ref 32.0–36.0)
MCV: 92.7 fL (ref 80.0–100.0)
MPV: 10.3 fL (ref 7.5–12.5)
Monocytes Relative: 11.3 %
Neutro Abs: 814 {cells}/uL — ABNORMAL LOW (ref 1500–7800)
Neutrophils Relative %: 35.4 %
Platelets: 238 Thousand/uL (ref 140–400)
RBC: 4.22 Million/uL (ref 3.80–5.10)
RDW: 15 % (ref 11.0–15.0)
Total Lymphocyte: 41.6 %
WBC: 2.3 Thousand/uL — ABNORMAL LOW (ref 3.8–10.8)

## 2023-11-06 LAB — RPR: RPR Ser Ql: NONREACTIVE

## 2023-11-06 LAB — HIV-1 RNA QUANT-NO REFLEX-BLD
HIV 1 RNA Quant: NOT DETECTED {copies}/mL
HIV-1 RNA Quant, Log: NOT DETECTED {Log_copies}/mL

## 2023-11-06 LAB — COMPLETE METABOLIC PANEL WITHOUT GFR
AG Ratio: 1 (calc) (ref 1.0–2.5)
ALT: 12 U/L (ref 6–29)
AST: 31 U/L (ref 10–35)
Albumin: 4 g/dL (ref 3.6–5.1)
Alkaline phosphatase (APISO): 84 U/L (ref 37–153)
BUN: 7 mg/dL (ref 7–25)
CO2: 27 mmol/L (ref 20–32)
Calcium: 9.3 mg/dL (ref 8.6–10.4)
Chloride: 106 mmol/L (ref 98–110)
Creat: 0.62 mg/dL (ref 0.50–1.03)
Globulin: 4 g/dL — ABNORMAL HIGH (ref 1.9–3.7)
Glucose, Bld: 92 mg/dL (ref 65–99)
Potassium: 4.8 mmol/L (ref 3.5–5.3)
Sodium: 139 mmol/L (ref 135–146)
Total Bilirubin: 0.5 mg/dL (ref 0.2–1.2)
Total Protein: 8 g/dL (ref 6.1–8.1)

## 2023-11-06 LAB — T-HELPER CELL (CD4) - (RCID CLINIC ONLY)
CD4 % Helper T Cell: 19 % — ABNORMAL LOW (ref 33–65)
CD4 T Cell Abs: 183 /uL — ABNORMAL LOW (ref 400–1790)

## 2023-12-18 ENCOUNTER — Other Ambulatory Visit: Payer: Self-pay | Admitting: Internal Medicine

## 2023-12-18 DIAGNOSIS — B20 Human immunodeficiency virus [HIV] disease: Secondary | ICD-10-CM

## 2023-12-30 ENCOUNTER — Ambulatory Visit: Payer: Self-pay | Admitting: Internal Medicine

## 2024-01-01 ENCOUNTER — Telehealth: Payer: Self-pay

## 2024-01-01 NOTE — Telephone Encounter (Signed)
 Received fax from [facility] stating they have been unable to get in touch with patient to schedule medication refill delivery and asking for alternate contact info.   No alternate phone numbers on file for patient. Called Katerine, no answer.   Danniel Grenz, BSN, RN

## 2024-01-31 ENCOUNTER — Ambulatory Visit: Payer: Self-pay | Admitting: Internal Medicine

## 2024-02-03 ENCOUNTER — Encounter: Payer: Self-pay | Admitting: Internal Medicine

## 2024-02-03 ENCOUNTER — Other Ambulatory Visit: Payer: Self-pay

## 2024-02-03 ENCOUNTER — Ambulatory Visit: Payer: Self-pay | Admitting: Internal Medicine

## 2024-02-03 VITALS — BP 117/77 | HR 71 | Temp 97.8°F | Ht 66.0 in | Wt 153.0 lb

## 2024-02-03 DIAGNOSIS — Z79899 Other long term (current) drug therapy: Secondary | ICD-10-CM

## 2024-02-03 DIAGNOSIS — B20 Human immunodeficiency virus [HIV] disease: Secondary | ICD-10-CM

## 2024-02-03 DIAGNOSIS — J302 Other seasonal allergic rhinitis: Secondary | ICD-10-CM

## 2024-02-03 MED ORDER — BIKTARVY 50-200-25 MG PO TABS: 1.0000 | ORAL_TABLET | Freq: Every day | ORAL | 11 refills | Status: AC

## 2024-02-03 NOTE — Progress Notes (Signed)
 RFV: follow up for hiv disease  Patient ID: Carolyn Williamson, female   DOB: May 31, 1970, 53 y.o.   MRN: [id number]  HPI Carolyn Williamson is a 53yo F, french speaking female, CD 4 count of 183/VL<20 in August 2025, who is recently restarted on taking biktarvy . Has not missed a dose since getting restarted on biktarvy   She thinks she had an episode of seasonal allergies a few weeks ago made her miss her appt.   Doesn't want an in person interpreter, she feels that they are not anonymous in her community.  Outpatient Encounter Medications as of 02/03/2024  Medication Sig   bictegravir-emtricitabine -tenofovir  AF (BIKTARVY ) 50-200-25 MG TABS tablet Take 1 tablet by mouth daily.   cetirizine  (ZYRTEC  ALLERGY) 10 MG tablet Take 1 tablet (10 mg total) by mouth daily. (Patient not taking: Reported on 11/04/2023)   hydrocortisone  1 % ointment Apply 1 application topically 2 (two) times daily.   ketotifen  (ALLERGY EYE DROPS) 0.025 % ophthalmic solution Place 1 drop into both eyes 2 (two) times daily.   rosuvastatin  (CRESTOR ) 10 MG tablet TAKE 1 TABLET(10 MG) BY MOUTH DAILY   sulfamethoxazole -trimethoprim  (BACTRIM  DS) 800-160 MG tablet Take 1 tablet by mouth daily. (Patient not taking: Reported on 11/04/2023)   No facility-administered encounter medications on file as of 02/03/2024.     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   Bilateral leg edema 02/25/2014   Abdominal muscle strain 01/29/2014   Premature menopause 01/06/2014   Seasonal allergies 01/06/2014   Low back pain 11/30/2013   Abdominal pain, epigastric 10/21/2013   Myopia 10/21/2013   Dental caries 10/21/2013   Immigrant with language difficulty 09/14/2013   Refugee health examination 09/14/2013   HIV disease (HCC) 09/14/2013   Acne vulgaris 09/14/2013     Health Maintenance Due  Topic Date Due   COVID-19 Vaccine (1) Never done   Zoster  Vaccines- Shingrix (1 of 2) Never done   Hepatitis B Vaccines 19-59 Average Risk (3 of 3 - 19+ 3-dose series) 05/06/2014   Colonoscopy  Never done   Influenza Vaccine  10/11/2023   Mammogram  03/24/2024     Review of Systems 12 point ros is negative except what is mentioned above Physical Exam   LMP 04/16/2013   BP 117/77   Pulse 71   Temp 97.8 F (36.6 C) (Oral)   Ht 5' 6 (1.676 m)   Wt 153 lb (69.4 kg)   LMP 04/16/2013   SpO2 98%   BMI 24.69 kg/m  Physical Exam  Constitutional:  oriented to person, place, and time. appears well-developed and well-nourished. No distress.  HENT: [place]/AT, PERRLA, no scleral icterus Mouth/Throat: Oropharynx is clear and moist. No oropharyngeal exudate.  Cardiovascular: Normal rate, regular rhythm and normal heart sounds. Exam reveals no gallop and no friction rub.  No murmur heard.  Pulmonary/Chest: Effort normal and breath sounds normal. No respiratory distress.  has no wheezes.  Neck = supple, no nuchal rigidity Lymphadenopathy: no cervical adenopathy. No axillary adenopathy Neurological: alert and oriented to person, place, and time.  Skin: Skin is warm and dry. No rash noted. No erythema.  Psychiatric: a normal mood and affect.  behavior is normal.   Lab Results  Component Value Date   CD4TCELL 19 (L) 11/04/2023   Lab Results  Component Value Date   CD4TABS 183 (L) 11/04/2023   CD4TABS 547 02/18/2023   CD4TABS 518 07/23/2022  Lab Results  Component Value Date   HIV1RNAQUANT NOT DETECTED 11/04/2023   Lab Results  Component Value Date   HEPBSAB NEG 10/06/2013   Lab Results  Component Value Date   LABRPR NON-REACTIVE 11/04/2023    CBC Lab Results  Component Value Date   WBC 2.3 (L) 11/04/2023   RBC 4.22 11/04/2023   HGB 13.0 11/04/2023   HCT 39.1 11/04/2023   PLT 238 11/04/2023   MCV 92.7 11/04/2023   MCH 30.8 11/04/2023   MCHC 33.2 11/04/2023   RDW 15.0 11/04/2023   LYMPHSABS 1,758 03/26/2022   MONOABS 154 (L)  10/02/2016   EOSABS 239 11/04/2023    BMET Lab Results  Component Value Date   NA 139 11/04/2023   K 4.8 11/04/2023   CL 106 11/04/2023   CO2 27 11/04/2023   GLUCOSE 92 11/04/2023   BUN 7 11/04/2023   CREATININE 0.62 11/04/2023   CALCIUM  9.3 11/04/2023   GFRNONAA 104 02/23/2019   GFRAA 121 02/23/2019      Assessment and Plan HIV disease = will plan to get Labs, refill on biktarvy   Long term medication management = will check cr  Seasonal allergies = recommend to start taking certizine daily  Health maintenance = did get mammogram, but has large outstanding bill that she is unclear why she is getting charged -- will try to help trouble shoot  Defer flu vaccine

## 2024-02-04 LAB — T-HELPER CELL (CD4) - (RCID CLINIC ONLY)
CD4 % Helper T Cell: 26 % — ABNORMAL LOW (ref 33–65)
CD4 T Cell Abs: 284 /uL — ABNORMAL LOW (ref 400–1790)

## 2024-02-06 LAB — CBC WITH DIFFERENTIAL/PLATELET
Absolute Lymphocytes: 1037 {cells}/uL (ref 850–3900)
Absolute Monocytes: 359 {cells}/uL (ref 200–950)
Basophils Absolute: 30 {cells}/uL (ref 0–200)
Basophils Relative: 1.1 %
Eosinophils Absolute: 178 {cells}/uL (ref 15–500)
Eosinophils Relative: 6.6 %
HCT: 38.2 % (ref 35.9–46.0)
Hemoglobin: 12.9 g/dL (ref 11.7–15.5)
MCH: 32 pg (ref 27.0–33.0)
MCHC: 33.8 g/dL (ref 31.6–35.4)
MCV: 94.8 fL (ref 81.4–101.7)
MPV: 9.7 fL (ref 7.5–12.5)
Monocytes Relative: 13.3 %
Neutro Abs: 1096 {cells}/uL — ABNORMAL LOW (ref 1500–7800)
Neutrophils Relative %: 40.6 %
Platelets: 298 Thousand/uL (ref 140–400)
RBC: 4.03 Million/uL (ref 3.80–5.10)
RDW: 12.6 % (ref 11.0–15.0)
Total Lymphocyte: 38.4 %
WBC: 2.7 Thousand/uL — ABNORMAL LOW (ref 3.8–10.8)

## 2024-02-06 LAB — HIV-1 RNA QUANT-NO REFLEX-BLD
HIV 1 RNA Quant: NOT DETECTED {copies}/mL
HIV-1 RNA Quant, Log: NOT DETECTED {Log_copies}/mL

## 2024-02-06 LAB — COMPLETE METABOLIC PANEL WITHOUT GFR
AG Ratio: 1 (calc) (ref 1.0–2.5)
ALT: 10 U/L (ref 6–29)
AST: 18 U/L (ref 10–35)
Albumin: 3.8 g/dL (ref 3.6–5.1)
Alkaline phosphatase (APISO): 90 U/L (ref 37–153)
BUN: 13 mg/dL (ref 7–25)
CO2: 24 mmol/L (ref 20–32)
Calcium: 8.9 mg/dL (ref 8.6–10.4)
Chloride: 105 mmol/L (ref 98–110)
Creat: 0.65 mg/dL (ref 0.50–1.03)
Globulin: 3.8 g/dL — ABNORMAL HIGH (ref 1.9–3.7)
Glucose, Bld: 107 mg/dL — ABNORMAL HIGH (ref 65–99)
Potassium: 4.5 mmol/L (ref 3.5–5.3)
Sodium: 136 mmol/L (ref 135–146)
Total Bilirubin: 0.3 mg/dL (ref 0.2–1.2)
Total Protein: 7.6 g/dL (ref 6.1–8.1)

## 2024-03-23 NOTE — Progress Notes (Signed)
 10-YEAR ASCVD RISK: 1.9%  Sex: Female Race: African American  Age: 54 Total Cholesterol (mg/dL) 825 HDL Cholesterol (mg/dL) 49 LDL Cholesterol (mg/dL) 889 Systolic Blood Pressure (mm Hg) 117 Diastolic Blood Pressure (mm Hg) 77 Diabetes: No Smoker: Never Treatment for Hypertension: No Aspirin Therapy: No Statin: Yes  Currently prescribed rosuvastatin  10 mg. NOT FILLING.  Sumayya Muha, BSN, RN

## 2024-05-04 ENCOUNTER — Ambulatory Visit: Payer: Self-pay | Admitting: Internal Medicine
